# Patient Record
Sex: Female | Born: 1970 | Race: Black or African American | Hispanic: No | Marital: Married | Smoking: Former smoker
Health system: Southern US, Community
[De-identification: ages and names within clinical notes are randomized; demographics above are authoritative.]

## PROBLEM LIST (undated history)

## (undated) DIAGNOSIS — J45909 Unspecified asthma, uncomplicated: Secondary | ICD-10-CM

## (undated) DIAGNOSIS — I10 Essential (primary) hypertension: Secondary | ICD-10-CM

## (undated) DIAGNOSIS — M1990 Unspecified osteoarthritis, unspecified site: Secondary | ICD-10-CM

## (undated) DIAGNOSIS — F419 Anxiety disorder, unspecified: Secondary | ICD-10-CM

## (undated) DIAGNOSIS — E785 Hyperlipidemia, unspecified: Secondary | ICD-10-CM

## (undated) DIAGNOSIS — M8700 Idiopathic aseptic necrosis of unspecified bone: Secondary | ICD-10-CM

## (undated) DIAGNOSIS — G709 Myoneural disorder, unspecified: Secondary | ICD-10-CM

## (undated) DIAGNOSIS — K219 Gastro-esophageal reflux disease without esophagitis: Secondary | ICD-10-CM

## (undated) DIAGNOSIS — F32A Depression, unspecified: Secondary | ICD-10-CM

## (undated) DIAGNOSIS — E119 Type 2 diabetes mellitus without complications: Secondary | ICD-10-CM

## (undated) HISTORY — DX: Hyperlipidemia, unspecified: E78.5

## (undated) HISTORY — DX: Gastro-esophageal reflux disease without esophagitis: K21.9

## (undated) HISTORY — DX: Anxiety disorder, unspecified: F41.9

## (undated) HISTORY — PX: HEMORRHOID SURGERY: SHX153

## (undated) HISTORY — DX: Depression, unspecified: F32.A

## (undated) HISTORY — PX: ABDOMINAL HYSTERECTOMY: SHX81

## (undated) HISTORY — DX: Unspecified asthma, uncomplicated: J45.909

---

## 2000-08-31 ENCOUNTER — Encounter: Payer: Self-pay | Admitting: *Deleted

## 2000-08-31 ENCOUNTER — Emergency Department (HOSPITAL_COMMUNITY): Admission: EM | Admit: 2000-08-31 | Discharge: 2000-08-31 | Payer: Self-pay | Admitting: *Deleted

## 2000-09-10 ENCOUNTER — Observation Stay (HOSPITAL_COMMUNITY): Admission: RE | Admit: 2000-09-10 | Discharge: 2000-09-11 | Payer: Self-pay | Admitting: *Deleted

## 2003-11-06 ENCOUNTER — Emergency Department (HOSPITAL_COMMUNITY): Admission: EM | Admit: 2003-11-06 | Discharge: 2003-11-06 | Payer: Self-pay | Admitting: Emergency Medicine

## 2004-08-07 ENCOUNTER — Emergency Department (HOSPITAL_COMMUNITY): Admission: EM | Admit: 2004-08-07 | Discharge: 2004-08-07 | Payer: Self-pay | Admitting: Emergency Medicine

## 2004-08-16 ENCOUNTER — Emergency Department (HOSPITAL_COMMUNITY): Admission: EM | Admit: 2004-08-16 | Discharge: 2004-08-16 | Payer: Self-pay | Admitting: *Deleted

## 2005-01-08 ENCOUNTER — Emergency Department (HOSPITAL_COMMUNITY): Admission: EM | Admit: 2005-01-08 | Discharge: 2005-01-08 | Payer: Self-pay | Admitting: Emergency Medicine

## 2006-01-02 ENCOUNTER — Emergency Department (HOSPITAL_COMMUNITY): Admission: EM | Admit: 2006-01-02 | Discharge: 2006-01-02 | Payer: Self-pay | Admitting: Emergency Medicine

## 2006-08-23 ENCOUNTER — Emergency Department (HOSPITAL_COMMUNITY): Admission: EM | Admit: 2006-08-23 | Discharge: 2006-08-23 | Payer: Self-pay | Admitting: Emergency Medicine

## 2006-11-05 ENCOUNTER — Emergency Department (HOSPITAL_COMMUNITY): Admission: EM | Admit: 2006-11-05 | Discharge: 2006-11-05 | Payer: Self-pay | Admitting: Emergency Medicine

## 2006-11-18 ENCOUNTER — Emergency Department (HOSPITAL_COMMUNITY): Admission: EM | Admit: 2006-11-18 | Discharge: 2006-11-18 | Payer: Self-pay | Admitting: Emergency Medicine

## 2007-10-12 ENCOUNTER — Ambulatory Visit (HOSPITAL_COMMUNITY): Admission: RE | Admit: 2007-10-12 | Discharge: 2007-10-12 | Payer: Self-pay | Admitting: General Surgery

## 2007-10-12 ENCOUNTER — Encounter (INDEPENDENT_AMBULATORY_CARE_PROVIDER_SITE_OTHER): Payer: Self-pay | Admitting: General Surgery

## 2007-12-15 ENCOUNTER — Ambulatory Visit: Payer: Self-pay | Admitting: Gastroenterology

## 2007-12-21 ENCOUNTER — Ambulatory Visit (HOSPITAL_COMMUNITY): Admission: RE | Admit: 2007-12-21 | Discharge: 2007-12-21 | Payer: Self-pay | Admitting: Gastroenterology

## 2007-12-21 ENCOUNTER — Encounter: Payer: Self-pay | Admitting: Gastroenterology

## 2007-12-21 ENCOUNTER — Ambulatory Visit: Payer: Self-pay | Admitting: Gastroenterology

## 2010-07-29 NOTE — Consult Note (Signed)
NAMEGENEVIVE, PRINTUP               ACCOUNT NO.:  [id number]   MEDICAL RECORD NO.:  [id number]          PATIENT TYPE:  AMB   LOCATION:  DAY                           FACILITY:  [facility]   PHYSICIAN:  Kassie Mends, M.D.      DATE OF BIRTH:  [birth year]   DATE OF CONSULTATION:  12/15/2007  DATE OF DISCHARGE:                                 CONSULTATION   REFERRING Eshaal Duby:  Tesfaye D. Felecia Shelling, MD.   REASON FOR CONSULTATION:  Rectal bleeding   HISTORY OF PRESENT ILLNESS:  Lisa [name] is a 40 year old female who 2  weeks ago saw blood, which she wiped after having a bowel movement.  She  denies any constipation or diarrhea.  She has a bowel movement, which is  felt once or twice a day.  Her appetite is good.  She denies any black  stool.  She did not have any heartburn or indigestions or problems  swallowing.  She denies use of aspirin, BC, or Goody Powder.  She uses  ibuprofen once a month.  She denies any weight loss.  Her appetite has  been good.   PAST MEDICAL HISTORY:  Hypertension.   ALLERGIES:  SULFA and DOXYCYCLINE.   MEDICATIONS:  Hydrochlorothiazide 12.5 mg daily.   FAMILY HISTORY:  She has no family history of colon cancer or colon  polyps.   SOCIAL HISTORY:  She is married for 14 years and has one 61 year old  child.  She works putting together cars.  She occasionally smokes when  she is depressed.  She denies any alcohol use.   REVIEW OF SYSTEMS:  As per the HPI.  Otherwise, all systems are  negative.   PHYSICAL EXAMINATION:  VITAL SIGNS:  Weight 255 pounds, height 5 feet 6  inches, temperature 98.7, blood pressure 120/80, and pulse 72.  GENERAL:  She is in no apparent distress, alert and oriented x4.HEENT:  Atraumatic and normocephalic.  Pupils equal and react to light.  Mouth,  no oral lesions.  Posterior pharynx without erythema or exudate.NECK:  Full range of motion.  No lymphadenopathy.LUNGS:  Clear to auscultation  bilaterally.CARDIOVASCULAR:  Regular rate and  rhythm.  No murmur.  Normal S1 and S2. ABDOMEN:  Bowel sounds are present.  Soft, nontender,  and nondistended.  No rebound or guarding.EXTREMITIES:  No cyanosis or  edema.   LABORATORY DATA:  In July 2009, her white count was 5.4, hemoglobin  13.2, and platelets 215.   ASSESSMENT:  Lisa [name] is a 40 year old female who has pain with rectal  bleeding.  The differential diagnosis include hemorrhoids, colorectal  polyp, or cancer.  Thank you for allowing me to see Lisa [name] in  consultation.  My recommendation are as follows.   RECOMMENDATIONS:  1. She will be scheduled for a colonoscopy with a HalfLytely bowel      prep next Wednesday.  2. She may follow up with me as needed.      Kassie Mends, M.D.  Electronically Signed     SM/MEDQ  D:  12/15/2007  T:  12/16/2007  Job:  [id number]   cc:  Tesfaye D. Legrand Rams, MD  Fax: [phone]

## 2010-07-29 NOTE — Op Note (Signed)
[name], [name]               ACCOUNT NO.:  [id number]   MEDICAL RECORD NO.:  [id number]          PATIENT TYPE:  AMB   LOCATION:  DAY                           FACILITY:  [facility]   PHYSICIAN:  Barbaraann Barthel, M.D. DATE OF BIRTH:  [birth year]   DATE OF PROCEDURE:  10/12/2007  DATE OF DISCHARGE:                               OPERATIVE REPORT   SURGEON:  Barbaraann Barthel, MD   PREOPERATIVE DIAGNOSIS:  Left axillary abscess, possible hidradenitis  suppurativa.   POSTOPERATIVE DIAGNOSIS:  Left axillary abscess, possible hidradenitis  suppurativa.   NOTE:  This is a 40 year old black female who had a previous abscesses  under her axillae.  She had previous excision of right axillary abscess  by Dr. Katrinka Blazing and she presented a couple weeks ago with drainage from her  left axilla and she was referred to my office.  She was placed on  antibiotics.  Apparently, she had some reaction to tetracycline, this  was discontinued, and the patient took Benadryl.  There were no new  rashes or any other signs of allergic reaction.  The patient was  admitted and Cipro was started in the outpatient department.  We planned  for an excision of this area.  We discussed complications not limited,  but including bleeding, infection, and possibility of further surgery  might be needed.  Informed consent was obtained.   GROSS FINDINGS:  Sinus tracts and an area of chronic inflammation in the  area of the left axilla.  This was excised and debrided.  Cultures were  sent.   TECHNIQUE:  The patient was placed in supine position.  After the  adequate administration of general anesthesia via LMA anesthesia, her  left arm and axillary area was prepped with Betadine solution and draped  in usual manner.  A limb isolator was used.  The area of the abscess was  excised continuously in order to remove the sinus tract along with it.  This was irrigated with normal saline solution.  The bleeding was  controlled with  a cautery device and I packed the wound open with the  sterile 4 x 4 soaked in saline and ABD pad, and sterile dressing was  applied.  Prior to closure, all sponge, needle, and instrument counts  were found to be correct.  Estimated blood loss was minimal.  Cultures  were obtained.  The patient received 500 mL crystalloids  intraoperatively.  There were no complications.  The wound  classification is contaminated.      Barbaraann Barthel, M.D.  Electronically Signed    WB/MEDQ  D:  10/12/2007  T:  10/13/2007  Job:  [id number]   cc:   Tesfaye D. Felecia Shelling, MD  Fax: [phone]

## 2010-07-29 NOTE — Op Note (Signed)
NAMESARAHBETH, Lisa [name]               ACCOUNT NO.:  [id number]   MEDICAL RECORD NO.:  [id number]          PATIENT TYPE:  AMB   LOCATION:  DAY                           FACILITY:  [facility]   PHYSICIAN:  Kassie Mends, M.D.      DATE OF BIRTH:  Apr 17, 1970   DATE OF PROCEDURE:  12/21/2007  DATE OF DISCHARGE:                               OPERATIVE REPORT   REFERRING PHYSICIAN:  Tesfaye D. Fanta, MD   PROCEDURE:  Ileocolonoscopy with cold forceps biopsy of the terminal  ileum.   INDICATION FOR EXAM:  Lisa Lisa [name] is a 40 year old female who complains  of rectal bleeding.  She also uses ibuprofen once a month.   FINDINGS:  1. Approximately 28-cm of the distal terminal ileum visualized.  The      visualized portion of the terminal ileum showed patchy erythematous      nodules with central ulceration.  Intermittently, she had normal      mucosa.  Biopsies were obtained via cold forceps.  2. Normal colon without evidence of polyps, mass, inflammatory      changes, diverticular AVMs.  3. Small internal hemorrhoids.  Otherwise, normal retroflexed view of      the rectum.   DIAGNOSIS:  Terminal ileitis.  The differential diagnosis includes NSAID-  induced disease or inflammatory bowel disease.  She has a little  likelihood of infectious etiology.  She is not having any diarrhea.   RECOMMENDATIONS:  1. No aspirin or NSAIDs for 30 days.  No anticoagulation for 5 days.  2. She should follow a high-fiber diet.  She is given handout on high-      fiber diet and hemorrhoids.  3. Screening colonoscopy at age 43.  4. Will call her with results of her biopsies.  After the results of      her biopsies are known, then we will schedule her follow up      appointment.   MEDICATIONS:  1. Demerol 75 mg IV.  2. Versed 4 mg IV.   PROCEDURE TECHNIQUE:  Physical exam was performed.  Informed consent was  obtained, from the patient explaining benefits, risks, and alternatives  to the procedure.  The patient was  connected to the monitor, placed in  the left lateral position.  Continuous oxygen was provided by nasal  cannula.  IV medicine administered through an indwelling cannula.  After  administration of sedation and rectal exam, the patient's rectum was  intubated and the scope was advanced under direct visualization to the  distal terminal ileum.  The scope was removed slowly by  carefully examining the color, texture, anatomy, and integrity of the  mucosa on the way out.  The patient was recovered in endoscopy and  discharged home in satisfactory condition.   PATH:  Acute focal inflammation and surface ulcerations likely 2o to  NSAIDs.      Kassie Mends, M.D.  Electronically Signed     SM/MEDQ  D:  12/21/2007  T:  12/21/2007  Job:  [id number]   cc:   Tesfaye D. Felecia Shelling, MD  Fax: [phone]

## 2010-12-12 LAB — CBC
Hemoglobin: 13.2
MCHC: 33.6
MCV: 89.4
RBC: 4.4
RDW: 12.2

## 2010-12-12 LAB — DIFFERENTIAL
Basophils Absolute: 0
Basophils Relative: 0
Eosinophils Absolute: 0.1
Monocytes Absolute: 0.4
Monocytes Relative: 7
Neutro Abs: 2.2

## 2010-12-12 LAB — BASIC METABOLIC PANEL
CO2: 29
Calcium: 9.8
Chloride: 107
Creatinine, Ser: 0.98
GFR calc Af Amer: >60
Glucose, Bld: 128 — ABNORMAL HIGH
Sodium: 140

## 2010-12-12 LAB — ANAEROBIC CULTURE

## 2010-12-12 LAB — CULTURE, ROUTINE-ABSCESS

## 2012-08-09 ENCOUNTER — Emergency Department (HOSPITAL_COMMUNITY)
Admission: EM | Admit: 2012-08-09 | Discharge: 2012-08-09 | Disposition: A | Discharge status: Home / Self Care | Payer: BC Managed Care – PPO | Attending: Emergency Medicine | Admitting: Emergency Medicine

## 2012-08-09 ENCOUNTER — Encounter (HOSPITAL_COMMUNITY): Payer: Self-pay

## 2012-08-09 DIAGNOSIS — IMO0002 Reserved for concepts with insufficient information to code with codable children: Secondary | ICD-10-CM | POA: Insufficient documentation

## 2012-08-09 DIAGNOSIS — I10 Essential (primary) hypertension: Secondary | ICD-10-CM | POA: Insufficient documentation

## 2012-08-09 DIAGNOSIS — L02412 Cutaneous abscess of left axilla: Secondary | ICD-10-CM

## 2012-08-09 DIAGNOSIS — Z79899 Other long term (current) drug therapy: Secondary | ICD-10-CM | POA: Insufficient documentation

## 2012-08-09 DIAGNOSIS — E119 Type 2 diabetes mellitus without complications: Secondary | ICD-10-CM | POA: Insufficient documentation

## 2012-08-09 DIAGNOSIS — F17200 Nicotine dependence, unspecified, uncomplicated: Secondary | ICD-10-CM | POA: Insufficient documentation

## 2012-08-09 HISTORY — DX: Essential (primary) hypertension: I10

## 2012-08-09 HISTORY — DX: Type 2 diabetes mellitus without complications: E11.9

## 2012-08-09 MED ORDER — HYDROCODONE-ACETAMINOPHEN 5-325 MG PO TABS: ORAL_TABLET | ORAL | Status: DC

## 2012-08-09 MED ORDER — LIDOCAINE HCL (PF) 1 % IJ SOLN
5.0000 mL | Freq: Once | INTRAMUSCULAR | Status: AC
  Administered 2012-08-09: 5 mL via INTRADERMAL
  Filled 2012-08-09: qty 5

## 2012-08-09 NOTE — ED Notes (Signed)
Pt c/o abscess under left arm x 3 days.  Was seen at [facility] yesterday and given an antibiotic.  Says doesn't feel like it is helping.

## 2012-08-09 NOTE — ED Provider Notes (Signed)
History     CSN: [id number]  Arrival date & time 08/09/12  1213   First MD Initiated Contact with Patient 08/09/12 1313      Chief Complaint  Patient presents with  . Abscess    (Consider location/radiation/quality/duration/timing/severity/associated sxs/prior treatment) HPI Comments: Lisa [name] is a 42 y.o. female who presents to the Emergency Department complaining of abscess to the left axilla.  States she was seen at another hospital ED yesterday for same and states she was given an antibiotic and advised to apply warm compresses.  She comes here today c/o worsening pain and increased redness to the area.  She states pain is worse with palpation and arm movement.  She denies fever, vomiting or red streaks.    The history is provided by the patient.    Past Medical History  Diagnosis Date  . Diabetes mellitus without complication   . Hypertension     Past Surgical History  Procedure Laterality Date  . Abdominal hysterectomy    . Hemorrhoid surgery      No family history on file.  History  Substance Use Topics  . Smoking status: Current Every Day Smoker  . Smokeless tobacco: Not on file  . Alcohol Use: No    OB History   Grav Para Term Preterm Abortions TAB SAB Ect Mult Living                  Review of Systems  Constitutional: Negative for fever and chills.  Gastrointestinal: Negative for nausea, vomiting and abdominal pain.  Musculoskeletal: Negative for joint swelling and arthralgias.  Skin: Positive for color change.       Abscess   Hematological: Negative for adenopathy.  All other systems reviewed and are negative.    Allergies  Peanut-containing drug products; Doxycycline; and Sulfa antibiotics  Home Medications   Current Outpatient Rx  Name  Route  Sig  Dispense  Refill  . clindamycin (CLEOCIN) 150 MG capsule   Oral   Take 150 mg by mouth 3 (three) times daily.         Marland Kitchen glipiZIDE-metformin (METAGLIP) 5-500 MG per tablet   Oral  Take 1 tablet by mouth 2 (two) times daily before a meal.         . losartan-hydrochlorothiazide (HYZAAR) 50-12.5 MG per tablet   Oral   Take 1 tablet by mouth daily.           BP 136/85  Pulse 96  Temp(Src) 98.3 F (36.8 C) (Oral)  Resp 20  SpO2 99%  Physical Exam  Nursing note and vitals reviewed. Constitutional: She is oriented to person, place, and time. She appears well-developed and well-nourished. No distress.  HENT:  Head: Normocephalic and atraumatic.  Cardiovascular: Normal rate, regular rhythm, normal heart sounds and intact distal pulses.   No murmur heard. Pulmonary/Chest: Effort normal and breath sounds normal. No respiratory distress.  Neurological: She is alert and oriented to person, place, and time. She exhibits normal muscle tone. Coordination normal.  Skin: Skin is warm. There is erythema.  Abscess to the left axilla with moderate fluctuance and surrounding erythema.  No drainage or lymphangitis.  Radial pulse brisk.       ED Course  Procedures (including critical care time)  Labs Reviewed - No data to display No results found.      MDM    INCISION AND DRAINAGE Performed by: Maxwell Caul. Consent: Verbal consent obtained. Risks and benefits: risks, benefits and alternatives were discussed Type:  abscess  Body area: left axilla Anesthesia: local infiltration  Incision was made with a #11 scalpel.  Local anesthetic: lidocaine 1% w/o epinephrine  Anesthetic total: 3 ml  Complexity: complex Blunt dissection to break up loculations  Drainage: purulent  Drainage amount: copious  Packing material: 1/4 in iodoform gauze  Patient tolerance: Patient tolerated the procedure well with no immediate complications.      Patient agrees to continue her antibiotic as directed, warm compresses and remove the packing in 2 days.  Agrees to return here if sx's worsen   The patient appears reasonably screened and/or stabilized for discharge  and I doubt any other medical condition or other [facility] requiring further screening, evaluation, or treatment in the ED at this time prior to discharge.   Veva Grimley L. Trisha Mangle, PA-C 08/09/12 2157

## 2012-08-09 NOTE — ED Notes (Signed)
Wound dressed with telfa, gauze, and coban.

## 2012-08-11 ENCOUNTER — Encounter (HOSPITAL_COMMUNITY): Payer: Self-pay | Admitting: *Deleted

## 2012-08-11 ENCOUNTER — Emergency Department (HOSPITAL_COMMUNITY)
Admission: EM | Admit: 2012-08-11 | Discharge: 2012-08-11 | Disposition: A | Discharge status: Home / Self Care | Payer: BC Managed Care – PPO | Attending: Emergency Medicine | Admitting: Emergency Medicine

## 2012-08-11 DIAGNOSIS — I10 Essential (primary) hypertension: Secondary | ICD-10-CM | POA: Insufficient documentation

## 2012-08-11 DIAGNOSIS — Z4801 Encounter for change or removal of surgical wound dressing: Secondary | ICD-10-CM | POA: Insufficient documentation

## 2012-08-11 DIAGNOSIS — Z792 Long term (current) use of antibiotics: Secondary | ICD-10-CM | POA: Insufficient documentation

## 2012-08-11 DIAGNOSIS — Z79899 Other long term (current) drug therapy: Secondary | ICD-10-CM | POA: Insufficient documentation

## 2012-08-11 DIAGNOSIS — F17200 Nicotine dependence, unspecified, uncomplicated: Secondary | ICD-10-CM | POA: Insufficient documentation

## 2012-08-11 DIAGNOSIS — E119 Type 2 diabetes mellitus without complications: Secondary | ICD-10-CM | POA: Insufficient documentation

## 2012-08-11 DIAGNOSIS — L0291 Cutaneous abscess, unspecified: Secondary | ICD-10-CM

## 2012-08-11 MED ORDER — CLINDAMYCIN HCL 150 MG PO CAPS: 300.0000 mg | ORAL_CAPSULE | Freq: Four times a day (QID) | ORAL | Status: DC

## 2012-08-11 NOTE — ED Notes (Signed)
Packing removal from left upper arm placed 2 days ago.  Denies pain.

## 2012-08-11 NOTE — ED Provider Notes (Signed)
History     CSN: [id number]  Arrival date & time 08/11/12  4098   First MD Initiated Contact with Patient 08/11/12 [phone]      Chief Complaint  Patient presents with  . packing removal     (Consider location/radiation/quality/duration/timing/severity/associated sxs/prior treatment) HPI Comments: Lisa [name] is a 42 y.o. female who presents to the Emergency Department requesting packing removal from an abscess that was drained two days ago.  States the pain is improved.  States she is taking clindamycin daily.  She denies fever , chills, or worsening symptoms  The history is provided by the patient.    Past Medical History  Diagnosis Date  . Diabetes mellitus without complication   . Hypertension     Past Surgical History  Procedure Laterality Date  . Abdominal hysterectomy    . Hemorrhoid surgery      No family history on file.  History  Substance Use Topics  . Smoking status: Current Every Day Smoker  . Smokeless tobacco: Not on file  . Alcohol Use: No    OB History   Grav Para Term Preterm Abortions TAB SAB Ect Mult Living                  Review of Systems  Constitutional: Negative for fever and chills.  Gastrointestinal: Negative for nausea and vomiting.  Musculoskeletal: Negative for joint swelling and arthralgias.  Skin: Positive for color change.       Abscess   Hematological: Negative for adenopathy.  All other systems reviewed and are negative.    Allergies  Peanut-containing drug products; Doxycycline; and Sulfa antibiotics  Home Medications   Current Outpatient Rx  Name  Route  Sig  Dispense  Refill  . clindamycin (CLEOCIN) 150 MG capsule   Oral   Take 150 mg by mouth 3 (three) times daily.         Marland Kitchen glipiZIDE-metformin (METAGLIP) 5-500 MG per tablet   Oral   Take 1 tablet by mouth 2 (two) times daily before a meal.         . HYDROcodone-acetaminophen (NORCO/VICODIN) 5-325 MG per tablet      Take one-two tabs po q 4-6 hrs prn  pain   20 tablet   0   . losartan-hydrochlorothiazide (HYZAAR) 50-12.5 MG per tablet   Oral   Take 1 tablet by mouth daily.           BP 119/51  Pulse 62  Temp(Src) 98.4 F (36.9 C) (Oral)  Resp 16  SpO2 100%  Physical Exam  Nursing note and vitals reviewed. Constitutional: She is oriented to person, place, and time. She appears well-developed and well-nourished. No distress.  HENT:  Head: Normocephalic and atraumatic.  Cardiovascular: Normal rate, regular rhythm and normal heart sounds.   Pulmonary/Chest: Effort normal and breath sounds normal.  Neurological: She is alert and oriented to person, place, and time. She exhibits normal muscle tone. Coordination normal.  Skin: Skin is warm. There is erythema.  Abscess to the left axilla.  Mild surrounding erythema still remains.  Abscess appears to be healing well.      ED Course  Procedures (including critical care time)  Labs Reviewed - No data to display No results found.      MDM    Patient currently taking Clindamycin.  Abscess appears to be improving.  Packing removed by me w/o difficulty.  Mild surrounding erythema still remains.  I will have patient increase the dose from 150 mg  TID to 300 mg QID .  She agrees to continue warm wet compresses and return here in 2-3 days if the redness is increasing.        Eason Housman L. Trisha Mangle, PA-C 08/11/12 2213

## 2012-08-11 NOTE — ED Notes (Signed)
Applied Telfa pad, kerlix and Coban dressing to L axilla abscess site.  Patient tolerated well.

## 2012-08-11 NOTE — ED Provider Notes (Signed)
Medical screening examination/treatment/procedure(s) were performed by non-physician practitioner and as supervising physician I was immediately available for consultation/collaboration.   Arrion Burruel W. Marguis Mathieson, MD 08/11/12 0032 

## 2012-08-12 NOTE — ED Provider Notes (Signed)
Medical screening examination/treatment/procedure(s) were performed by non-physician practitioner and as supervising physician I was immediately available for consultation/collaboration.   Joya Gaskins, MD 08/12/12 [phone]

## 2013-03-22 ENCOUNTER — Ambulatory Visit (INDEPENDENT_AMBULATORY_CARE_PROVIDER_SITE_OTHER): Payer: BC Managed Care – PPO | Admitting: Orthopedic Surgery

## 2013-03-22 ENCOUNTER — Encounter: Payer: Self-pay | Admitting: Orthopedic Surgery

## 2013-03-22 VITALS — BP 145/88 | Ht 66.0 in | Wt 250.0 lb

## 2013-03-22 DIAGNOSIS — M7552 Bursitis of left shoulder: Secondary | ICD-10-CM

## 2013-03-22 DIAGNOSIS — M87059 Idiopathic aseptic necrosis of unspecified femur: Secondary | ICD-10-CM

## 2013-03-22 DIAGNOSIS — M5126 Other intervertebral disc displacement, lumbar region: Secondary | ICD-10-CM

## 2013-03-22 DIAGNOSIS — M67919 Unspecified disorder of synovium and tendon, unspecified shoulder: Secondary | ICD-10-CM

## 2013-03-22 DIAGNOSIS — M719 Bursopathy, unspecified: Secondary | ICD-10-CM

## 2013-03-22 NOTE — Patient Instructions (Addendum)
[facility] ordered bilateral hips and Lspine
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
## 2013-04-04 ENCOUNTER — Ambulatory Visit
Admission: RE | Admit: 2013-04-04 | Discharge: 2013-04-04 | Disposition: A | Discharge status: Home / Self Care | Payer: BC Managed Care – PPO | Source: Ambulatory Visit | Attending: Orthopedic Surgery | Admitting: Orthopedic Surgery

## 2013-04-04 DIAGNOSIS — M87059 Idiopathic aseptic necrosis of unspecified femur: Secondary | ICD-10-CM

## 2013-04-04 DIAGNOSIS — M5126 Other intervertebral disc displacement, lumbar region: Secondary | ICD-10-CM

## 2013-04-05 ENCOUNTER — Telehealth: Payer: Self-pay | Admitting: Orthopedic Surgery

## 2013-04-05 NOTE — Telephone Encounter (Signed)
Received call from Debra, [facility], [place] location, ph# [phone], relaying that # of visits on order received is indicated as "1"; patient's appointment is scheduled for today, 04/05/13, 4:45pm; please advise regarding # of visits and weeks for diagnosis, bursitis of shoulder.  States therapist's recommendation for this problem is 3x per week for minimum of 4 weeks, or as Dr. Romeo AppleHarrison advises.  Please call phone # above,  or fax orders to fax # [phone].

## 2013-04-05 NOTE — Telephone Encounter (Signed)
Advised that the order automatically defaults to 1 visit but Dr. Romeo AppleHarrison normally does 3 times a week for 6 weeks. The patient came in to bring her MRI disc and stated she is waiting on her flex card to pay.

## 2013-04-11 ENCOUNTER — Telehealth: Payer: Self-pay | Admitting: Orthopedic Surgery

## 2013-04-11 NOTE — Telephone Encounter (Signed)
Lisa AceAngela [name] asked for a call about her MRI results.  Her # [phone]

## 2013-04-24 ENCOUNTER — Encounter: Payer: Self-pay | Admitting: Orthopedic Surgery

## 2013-05-30 ENCOUNTER — Ambulatory Visit (INDEPENDENT_AMBULATORY_CARE_PROVIDER_SITE_OTHER): Payer: BC Managed Care – PPO | Admitting: Orthopedic Surgery

## 2013-05-30 VITALS — BP 129/88 | Ht 66.0 in | Wt 250.0 lb

## 2013-05-30 DIAGNOSIS — M87059 Idiopathic aseptic necrosis of unspecified femur: Secondary | ICD-10-CM

## 2013-05-30 DIAGNOSIS — M879 Osteonecrosis, unspecified: Secondary | ICD-10-CM | POA: Insufficient documentation

## 2013-05-30 NOTE — Patient Instructions (Addendum)
You are being referred to a joint specialist to take over care and evaluate you for possible hip replacement (DR. Charlann Boxer)   Encounter Diagnosis  Name Primary?  . Osteonecrosis of hip Yes    Avascular Necrosis Avascular necrosis is a disease resulting from the temporary or permanent loss of the blood supply to the bones. Without blood, the bone tissue dies and causes the bone to become soft. If the process involves the bone near a joint, it may lead to collapse of the joint surface. This disease is also known as:  Osteonecrosis.  Aseptic necrosis.  Ischemic bone necrosis. Avascular necrosis most commonly affects the ends (epiphysis) of long bones. The femur, the bone extending from the knee joint to the hip joint, is the bone most commonly involved. The disease may affect 1 bone, more than 1 bone at the same time, more than 1 bone at different times. It affects men and women equally. Avascular necrosis occurs at any age. But it is more common between the ages of 23 and 50 years. SYMPTOMS  In early stages patients may not have any symptoms. But as the disease progresses, joint pain generally develops. At first there is pain when putting weight on the affected joint, and then when resting. Pain usually develops gradually. It may be mild or severe. As the disease progresses and the bone and surrounding joint surface collapses, pain may develop or increase dramatically. Pain may be severe enough to limit range of motion in the affected joint. The period of time between the first symptoms and loss of joint function is different for each patient. This can range from several months to more than a year. Disability depends on:  What part of the bone is affected.  How large an area is involved.  How effectively the bone repairs itself.  If other illnesses are present.  If you are being treated for cancer with medications (chemotherapy).  Radiation.  The cause of the avascular necrosis. DIAGNOSIS   The diagnosis of aseptic necrosis is usually made by:  Taking a history.  Doing an exam.  Taking X-rays. (If X-rays are normal, an MRI may be required.)  Sometimes further blood work and specialized studies may be necessary. TREATMENT  Treatment for this disease is necessary to maintain joint function. If untreated, most patients will suffer severe pain and limitation in movement within 2 years. Several treatments are available that help prevent further bone and joint damage. They can also reduce pain. To determine the most appropriate treatment, the caregiver considers the following aspects of a patient's disease:  The age of the patient.  The stage of the disease (early or late).  The location and amount of bone affected. It may be a small or large area.  The underlying cause of avascular necrosis. The goals in treatment are to:  Improve the patient's use of the affected joint.  Stop further damage to the bone.  Improve bone and joint survival. Your caregiver may use one or more of the following treatments:  Reduced weight bearing. If avascular necrosis is diagnosed early, the caregiver may begin treatment by having the patient limit weight on the affected joint. The caregiver may recommend limiting activities or using crutches. In some cases, reduced weight bearing can slow the damage caused by the disease and permit natural healing. When combined with medication to reduce pain, reduced weight bearing can be an effective way to avoid or delay surgery for some patients. Most patients eventually will need surgery to  reconstruct the joint.  Core decompression. Core decompression works best in people who are in the earliest stages of avascular necrosis, before the collapse of the joint. This procedure often can reduce pain and slow the progression of bone and joint destruction in these patients. This surgical procedure removes the inner layer of bone, which:  Reduces pressure within  the bone.  Increases blood flow to the bone.  Allows more blood vessels to form.  Reduces pain.  Osteotomy. This surgical procedure re-shapes the bone to reduce stress on the affected area of the joint. There is a lengthy recovery period. The patient's activities are very limited for 3 to 12 months after an osteotomy. This procedure is most effective for younger patients with advanced avascular necrosis, and those with a large area of affected bone.  Bone Graft. A bone graft may be used to support a joint after core decompression. Bone grafting is surgery that transplants healthy bone from one part of the patient, such as the leg, to the diseased area. Sometimes the bone is taken with it's blood vessels which are attached to local blood vessels near the area of bone collapse. This is called a vascularized bone graft. There is a lengthy recovery period after a bone graft, usually from 6 to 12 months. This procedure is technically complex.  Arthroplasty. Arthroplasty is also known as total joint replacement. Total joint replacement is used in late-stage avascular necrosis, and when the joint is deformed. In this surgery, the diseased joint is replaced with artificial parts. It may be recommended for people who are not good candidates for other treatments, such as patients who may not do well with repeated attempts to preserve the joint. Various types of replacements are available, and patients should discuss specific needs with their caregiver. New treatments being tried include:  The use of medications.  Electrical stimulation.  Combination therapies to increase the growth of new bone and blood vessels. Document Released: 08/22/2001 Document Revised: 05/25/2011 Document Reviewed: 10/23/2008 [facility] Patient Information 2014 [place], [place].

## 2013-05-30 NOTE — Progress Notes (Signed)
Patient ID: Lisa [name], female   DOB: 09/21/1970, 43 y.o.   MRN: [id number]  Chief Complaint  Patient presents with  . Results    MRI results    BP 129/88  Ht 5\' 6"  (1.676 m)  Wt 250 lb (113.399 kg)  BMI 40.37 kg/m2   2 MRIs one of the hip and one of the back she has bilateral AVN left worse than right his left hip pain and some left buttock pain as well. Her MRI shows degenerative disc disease lumbar spine very mild I doubt this is the source of her problem  Her AVN on the left is much more extensive than the AVN on the right I think she should be evaluated for total hip arthroplasty. I think she'll be a good candidate for anterior approach with perhaps a ceramic head and cross-link polyethylene and her age of 43 however, this can be determined and will be determined by the treating physician  She should continue Naprosyn for pain and/or muscle relaxer as needed

## 2013-05-31 ENCOUNTER — Telehealth: Payer: Self-pay | Admitting: *Deleted

## 2013-05-31 NOTE — Telephone Encounter (Signed)
yes

## 2013-05-31 NOTE — Telephone Encounter (Signed)
Patient called to give the names of the two medications that she could not think of during her office visit yesterday (05/30/13). They are Naproxen 500 mg BID and Cyclobenzaprine 10 mg BID. Do you want to refill as directions as stated? Please advise. [facility] [place].

## 2013-06-01 ENCOUNTER — Other Ambulatory Visit: Payer: Self-pay | Admitting: *Deleted

## 2013-06-01 DIAGNOSIS — M879 Osteonecrosis, unspecified: Secondary | ICD-10-CM

## 2013-06-01 MED ORDER — CYCLOBENZAPRINE HCL 10 MG PO TABS: 10.0000 mg | ORAL_TABLET | Freq: Two times a day (BID) | ORAL | Status: DC | PRN

## 2013-06-01 MED ORDER — NAPROXEN 500 MG PO TABS: 500.0000 mg | ORAL_TABLET | Freq: Two times a day (BID) | ORAL | Status: DC

## 2013-06-01 NOTE — Telephone Encounter (Signed)
Medications sent electronically to pharmacy.

## 2013-06-05 ENCOUNTER — Telehealth: Payer: Self-pay | Admitting: *Deleted

## 2013-06-05 DIAGNOSIS — M8700 Idiopathic aseptic necrosis of unspecified bone: Secondary | ICD-10-CM

## 2013-06-05 NOTE — Telephone Encounter (Signed)
[facility] Patient is scheduled to see Dr. Charlann Boxerlin 08/09/13 at 3:30 pm. Patient aware.

## 2013-07-10 ENCOUNTER — Other Ambulatory Visit: Payer: Self-pay | Admitting: *Deleted

## 2013-07-10 DIAGNOSIS — M879 Osteonecrosis, unspecified: Secondary | ICD-10-CM

## 2013-07-10 MED ORDER — CYCLOBENZAPRINE HCL 10 MG PO TABS: 10.0000 mg | ORAL_TABLET | Freq: Two times a day (BID) | ORAL | Status: DC | PRN

## 2013-08-08 ENCOUNTER — Other Ambulatory Visit: Payer: Self-pay | Admitting: *Deleted

## 2013-08-08 DIAGNOSIS — M879 Osteonecrosis, unspecified: Secondary | ICD-10-CM

## 2013-08-08 MED ORDER — NAPROXEN 500 MG PO TABS: 500.0000 mg | ORAL_TABLET | Freq: Two times a day (BID) | ORAL | Status: DC

## 2013-09-21 ENCOUNTER — Other Ambulatory Visit: Payer: Self-pay | Admitting: *Deleted

## 2013-09-21 ENCOUNTER — Encounter (HOSPITAL_COMMUNITY): Payer: Self-pay | Admitting: Pharmacy Technician

## 2013-09-21 MED ORDER — CYCLOBENZAPRINE HCL 10 MG PO TABS: 10.0000 mg | ORAL_TABLET | Freq: Two times a day (BID) | ORAL | Status: DC | PRN

## 2013-09-29 NOTE — Patient Instructions (Addendum)
20 Lisa [name]  09/29/2013   Your procedure is scheduled on: Monday July 27th, 2015  [facility] and follow signs to  Short Stay Center at 1100 AM.  Call this number if you have problems the morning of surgery [phone]   Remember:  Do not eat food  :After Midnight.               clear liquids midnight until 700 am day of surgry, then nothing by mouth after 700 am.   Take these medicines the morning of surgery with A SIP OF WATER: none                               You may not have any metal on your body including hair pins and piercings  Do not wear jewelry, make-up, lotions, powders, or deodorant.   Men may shave face and neck.  Do not bring valuables to the hospital. [facility] IS NOT RESPONSIBLE FOR VALUABLES.  Contacts, dentures or bridgework may not be worn into surgery.  Leave suitcase in the car. After surgery it may be brought to your room.  For patients admitted to the hospital, checkout time is 11:00 AM the day of discharge.   Patients discharged the day of surgery will not be allowed to drive home.  Name and phone number of your driver:  Special Instructions: N/A ________________________________________________________________________  [facility] - Preparing for [facility] Before surgery, you can play an important role.  Because skin is not sterile, your skin needs to be as free of germs as possible.  You can reduce the number of germs on your skin by washing with CHG (chlorahexidine gluconate) soap before surgery.  CHG is an antiseptic cleaner which kills germs and bonds with the skin to continue killing germs even after washing. Please DO NOT use if you have an allergy to CHG or antibacterial soaps.  If your skin becomes reddened/irritated stop using the CHG and inform your nurse when you arrive at Short Stay. Do not shave (including legs and underarms) for at least 48 hours prior to the first CHG shower.  You may shave your  face/neck. Please follow these instructions carefully:  1.  Shower with CHG Soap the night before surgery and the  morning of Surgery.  2.  If you choose to wash your hair, wash your hair first as usual with your  normal  shampoo.  3.  After you shampoo, rinse your hair and body thoroughly to remove the  shampoo.                           4.  Use CHG as you would any other liquid soap.  You can apply chg directly  to the skin and wash                       Gently with a scrungie or clean washcloth.  5.  Apply the CHG Soap to your body ONLY FROM THE NECK DOWN.   Do not use on face/ open                           Wound or open sores. Avoid contact with eyes, ears mouth and genitals (private parts).  Wash face,  Genitals (private parts) with your normal soap.             6.  Wash thoroughly, paying special attention to the area where your surgery  will be performed.  7.  Thoroughly rinse your body with warm water from the neck down.  8.  DO NOT shower/wash with your normal soap after using and rinsing off  the CHG Soap.                9.  Pat yourself dry with a clean towel.            10.  Wear clean pajamas.            11.  Place clean sheets on your bed the night of your first shower and do not  sleep with pets. Day of Surgery : Do not apply any lotions/deodorants the morning of surgery.  Please wear clean clothes to the hospital/surgery center.  FAILURE TO FOLLOW THESE INSTRUCTIONS MAY RESULT IN THE CANCELLATION OF YOUR SURGERY PATIENT SIGNATURE_________________________________  NURSE SIGNATURE__________________________________  ________________________________________________________________________    CLEAR LIQUID DIET   Foods Allowed                                                                     Foods Excluded  Coffee and tea, regular and decaf                             liquids that you cannot  Plain Jell-O in any flavor                                              see through such as: Fruit ices (not with fruit pulp)                                     milk, soups, orange juice  Iced Popsicles                                    All solid food Carbonated beverages, regular and diet                                    Cranberry, grape and apple juices Sports drinks like Gatorade Lightly seasoned clear broth or consume(fat free) Sugar, honey syrup  Sample Menu Breakfast                                Lunch                                     Supper Cranberry juice  Beef broth                            Chicken broth Jell-O                                     Grape juice                           Apple juice Coffee or tea                        Jell-O                                      Popsicle                                                Coffee or tea                        Coffee or tea  _____________________________________________________________________    Incentive Spirometer  An incentive spirometer is a tool that can help keep your lungs clear and active. This tool measures how well you are filling your lungs with each breath. Taking long deep breaths may help reverse or decrease the chance of developing breathing (pulmonary) problems (especially infection) following:  A long period of time when you are unable to move or be active. BEFORE THE PROCEDURE   If the spirometer includes an indicator to show your best effort, your nurse or respiratory therapist will set it to a desired goal.  If possible, sit up straight or lean slightly forward. Try not to slouch.  Hold the incentive spirometer in an upright position. INSTRUCTIONS FOR USE  1. Sit on the edge of your bed if possible, or sit up as far as you can in bed or on a chair. 2. Hold the incentive spirometer in an upright position. 3. Breathe out normally. 4. Place the mouthpiece in your mouth and seal your lips tightly around it. 5. Breathe in slowly  and as deeply as possible, raising the piston or the ball toward the top of the column. 6. Hold your breath for 3-5 seconds or for as long as possible. Allow the piston or ball to fall to the bottom of the column. 7. Remove the mouthpiece from your mouth and breathe out normally. 8. Rest for a few seconds and repeat Steps 1 through 7 at least 10 times every 1-2 hours when you are awake. Take your time and take a few normal breaths between deep breaths. 9. The spirometer may include an indicator to show your best effort. Use the indicator as a goal to work toward during each repetition. 10. After each set of 10 deep breaths, practice coughing to be sure your lungs are clear. If you have an incision (the cut made at the time of surgery), support your incision when coughing by placing a pillow or rolled up towels firmly against it. Once you are able to get out of bed, walk around indoors and cough well. You may stop using the incentive  spirometer when instructed by your caregiver.  RISKS AND COMPLICATIONS  Take your time so you do not get dizzy or light-headed.  If you are in pain, you may need to take or ask for pain medication before doing incentive spirometry. It is harder to take a deep breath if you are having pain. AFTER USE  Rest and breathe slowly and easily.  It can be helpful to keep track of a log of your progress. Your caregiver can provide you with a simple table to help with this. If you are using the spirometer at home, follow these instructions: SEEK MEDICAL CARE IF:   You are having difficultly using the spirometer.  You have trouble using the spirometer as often as instructed.  Your pain medication is not giving enough relief while using the spirometer.  You develop fever of 100.5 F (38.1 C) or higher. SEEK IMMEDIATE MEDICAL CARE IF:   You cough up bloody sputum that had not been present before.  You develop fever of 102 F (38.9 C) or greater.  You develop  worsening pain at or near the incision site. MAKE SURE YOU:   Understand these instructions.  Will watch your condition.  Will get help right away if you are not doing well or get worse. Document Released: 07/13/2006 Document Revised: 05/25/2011 Document Reviewed: 09/13/2006 Exit[employer] Patient Information 2014 [employer], [place].   ________________________________________________________________________  WHAT IS A BLOOD TRANSFUSION? Blood Transfusion Information  A transfusion is the replacement of blood or some of its parts. Blood is made up of multiple cells which provide different functions.  Red blood cells carry oxygen and are used for blood loss replacement.  White blood cells fight against infection.  Platelets control bleeding.  Plasma helps clot blood.  Other blood products are available for specialized needs, such as hemophilia or other clotting disorders. BEFORE THE TRANSFUSION  Who gives blood for transfusions?   Healthy volunteers who are fully evaluated to make sure their blood is safe. This is blood bank blood. Transfusion therapy is the safest it has ever been in the practice of medicine. Before blood is taken from a donor, a complete history is taken to make sure that person has no history of diseases nor engages in risky social behavior (examples are intravenous drug use or sexual activity with multiple partners). The donor's travel history is screened to minimize risk of transmitting infections, such as malaria. The donated blood is tested for signs of infectious diseases, such as HIV and hepatitis. The blood is then tested to be sure it is compatible with you in order to minimize the chance of a transfusion reaction. If you or a relative donates blood, this is often done in anticipation of surgery and is not appropriate for emergency situations. It takes many days to process the donated blood. RISKS AND COMPLICATIONS Although transfusion therapy is very safe and saves  many lives, the main dangers of transfusion include:   Getting an infectious disease.  Developing a transfusion reaction. This is an allergic reaction to something in the blood you were given. Every precaution is taken to prevent this. The decision to have a blood transfusion has been considered carefully by your caregiver before blood is given. Blood is not given unless the benefits outweigh the risks. AFTER THE TRANSFUSION  Right after receiving a blood transfusion, you will usually feel much better and more energetic. This is especially true if your red blood cells have gotten low (anemic). The transfusion raises the level of the red  blood cells which carry oxygen, and this usually causes an energy increase.  The nurse administering the transfusion will monitor you carefully for complications. HOME CARE INSTRUCTIONS  No special instructions are needed after a transfusion. You may find your energy is better. Speak with your caregiver about any limitations on activity for underlying diseases you may have. SEEK MEDICAL CARE IF:   Your condition is not improving after your transfusion.  You develop redness or irritation at the intravenous (IV) site. SEEK IMMEDIATE MEDICAL CARE IF:  Any of the following symptoms occur over the next 12 hours:  Shaking chills.  You have a temperature by mouth above 102 F (38.9 C), not controlled by medicine.  Chest, back, or muscle pain.  People around you feel you are not acting correctly or are confused.  Shortness of breath or difficulty breathing.  Dizziness and fainting.  You get a rash or develop hives.  You have a decrease in urine output.  Your urine turns a dark color or changes to pink, red, or brown. Any of the following symptoms occur over the next 10 days:  You have a temperature by mouth above 102 F (38.9 C), not controlled by medicine.  Shortness of breath.  Weakness after normal activity.  The white part of the eye turns  yellow (jaundice).  You have a decrease in the amount of urine or are urinating less often.  Your urine turns a dark color or changes to pink, red, or brown. Document Released: 02/28/2000 Document Revised: 05/25/2011 Document Reviewed: 10/17/2007 [facility] Patient Information 2014 [place], [place].  _______________________________________________________________________

## 2013-10-02 ENCOUNTER — Ambulatory Visit (HOSPITAL_COMMUNITY)
Admission: RE | Admit: 2013-10-02 | Discharge: 2013-10-02 | Disposition: A | Discharge status: Home / Self Care | Payer: BC Managed Care – PPO | Source: Ambulatory Visit | Attending: Anesthesiology | Admitting: Anesthesiology

## 2013-10-02 ENCOUNTER — Encounter (HOSPITAL_COMMUNITY): Payer: Self-pay

## 2013-10-02 ENCOUNTER — Other Ambulatory Visit (HOSPITAL_COMMUNITY): Payer: Self-pay | Admitting: Diagnostic Radiology

## 2013-10-02 ENCOUNTER — Other Ambulatory Visit: Payer: Self-pay

## 2013-10-02 ENCOUNTER — Encounter (HOSPITAL_COMMUNITY)
Admission: RE | Admit: 2013-10-02 | Discharge: 2013-10-02 | Disposition: A | Discharge status: Home / Self Care | Payer: BC Managed Care – PPO | Source: Ambulatory Visit | Attending: Orthopedic Surgery | Admitting: Orthopedic Surgery

## 2013-10-02 DIAGNOSIS — Z87891 Personal history of nicotine dependence: Secondary | ICD-10-CM | POA: Insufficient documentation

## 2013-10-02 DIAGNOSIS — Z01812 Encounter for preprocedural laboratory examination: Secondary | ICD-10-CM | POA: Insufficient documentation

## 2013-10-02 DIAGNOSIS — Z01818 Encounter for other preprocedural examination: Secondary | ICD-10-CM | POA: Insufficient documentation

## 2013-10-02 HISTORY — DX: Idiopathic aseptic necrosis of unspecified bone: M87.00

## 2013-10-02 LAB — BASIC METABOLIC PANEL
ANION GAP: 12 (ref 5–15)
BUN: 17 mg/dL (ref 6–23)
CHLORIDE: 102 meq/L (ref 96–112)
CO2: 29 mEq/L (ref 19–32)
CREATININE: 0.98 mg/dL (ref 0.50–1.10)
Calcium: 10.3 mg/dL (ref 8.4–10.5)
GFR calc non Af Amer: 70 mL/min — ABNORMAL LOW (ref >90)
GFR, EST AFRICAN AMERICAN: 81 mL/min — AB (ref >90)
Glucose, Bld: 56 mg/dL — ABNORMAL LOW (ref 70–99)
POTASSIUM: 3.5 meq/L — AB (ref 3.7–5.3)
Sodium: 143 mEq/L (ref 137–147)

## 2013-10-02 LAB — CBC
HCT: 40.9 % (ref 36.0–46.0)
HEMOGLOBIN: 13.3 g/dL (ref 12.0–15.0)
MCH: 29.3 pg (ref 26.0–34.0)
MCHC: 32.5 g/dL (ref 30.0–36.0)
MCV: 90.1 fL (ref 78.0–100.0)
Platelets: 225 10*3/uL (ref 150–400)
RBC: 4.54 MIL/uL (ref 3.87–5.11)
RDW: 13.1 % (ref 11.5–15.5)
WBC: 6.7 10*3/uL (ref 4.0–10.5)

## 2013-10-02 LAB — PROTIME-INR
INR: 1.02 (ref 0.00–1.49)
PROTHROMBIN TIME: 13.4 s (ref 11.6–15.2)

## 2013-10-02 LAB — URINALYSIS, ROUTINE W REFLEX MICROSCOPIC
BILIRUBIN URINE: NEGATIVE
Glucose, UA: NEGATIVE mg/dL
Hgb urine dipstick: NEGATIVE
Ketones, ur: NEGATIVE mg/dL
Leukocytes, UA: NEGATIVE
NITRITE: NEGATIVE
Protein, ur: NEGATIVE mg/dL
Specific Gravity, Urine: 1.021 (ref 1.005–1.030)
Urobilinogen, UA: 1 mg/dL (ref 0.0–1.0)
pH: 6 (ref 5.0–8.0)

## 2013-10-02 LAB — APTT: APTT: 37 s (ref 24–37)

## 2013-10-02 LAB — SURGICAL PCR SCREEN
MRSA, PCR: INVALID — AB
Staphylococcus aureus: INVALID — AB

## 2013-10-05 LAB — MRSA CULTURE

## 2013-10-06 NOTE — H&P (Signed)
Lisa [name] is an 43 y.o. female.    Procedure:  Left hip core decompression  Chief Complaint:    Left hip AVN and pain   HPI: Pt is a 43 y.o. female complaining of left hip pain for 2+ years. Pain had continually increased since the beginning. She's been previously diagnosed with AVN of this left hip by MRI. We have been trying to manage it conservatively; however, she's had persistent, recurring problems with this hip pain. We have addressed the soft tissue based treatment with injections in the past without much relief.  Examination finds she walks with a limp, favoring this left hip, sometimes requiring the use of an assistive device to get around. She does have pain with hip flexion, internal rotation. She does not have this on her right hip currently. She is otherwise neurovascularly intact. She continues to have tenderness over the lateral side of her hip.  In the office, repeated plain films, AP pelvis and lateral of the left hip. These did not show any collapse. There is still cystic change within the femoral head.discussed that she would be on the use of a walker or crutches, limited weight bearing for 4-6 weeks after this type of procedure to prevent any complications, twisting mechanisms or subtrochanteric fracture from this.   Risks, benefits and expectations were discussed with the patient.  Risks including but not limited to the risk of anesthesia, blood clots, nerve damage, blood vessel damage, failure of the prosthesis, infection and up to and including death.  Patient understand the risks, benefits and expectations and wishes to proceed with surgery.    PCP: Avon Gully, MD  D/C Plans:      Home with HHPT  Post-op Meds:       No Rx given  Tranexamic Acid:      To be given - IV    Decadron:      Is to be given  FYI:     ASA post-op  Norco post-op   PMH: Past Medical History  Diagnosis Date  . Diabetes mellitus without complication   . Hypertension   .  Avascular necrosis left hip    PSH: Past Surgical History  Procedure Laterality Date  . Abdominal hysterectomy    . Hemorrhoid surgery      Social History:  reports that she has been smoking Cigarettes.  She has a .25 pack-year smoking history. She has never used smokeless tobacco. She reports that she does not drink alcohol or use illicit drugs.  Allergies:  Allergies  Allergen Reactions  . Peanut-Containing Drug Products Anaphylaxis and Hives  . Doxycycline Hives  . Sulfa Antibiotics Hives    Medications: No current facility-administered medications for this encounter.   Current Outpatient Prescriptions  Medication Sig Dispense Refill  . cyclobenzaprine (FLEXERIL) 10 MG tablet Take 1 tablet (10 mg total) by mouth 2 (two) times daily as needed for muscle spasms.  180 tablet  3  . glyBURIDE-metformin (GLUCOVANCE) 2.5-500 MG per tablet Take 1 tablet by mouth 2 (two) times daily with a meal.      . hydrochlorothiazide (MICROZIDE) 12.5 MG capsule Take 12.5 mg by mouth every morning.      Marland Kitchen losartan-hydrochlorothiazide (HYZAAR) 50-12.5 MG per tablet Take 1 tablet by mouth every morning.      . naproxen (NAPROSYN) 500 MG tablet Take 500 mg by mouth 2 (two) times daily with a meal.      . traMADol (ULTRAM) 50 MG tablet Take 50 mg by  mouth every 6 (six) hours as needed for moderate pain.        Review of Systems  Constitutional: Negative.   HENT: Negative.   Eyes: Negative.   Respiratory: Negative.   Cardiovascular: Negative.   Gastrointestinal: Negative.   Genitourinary: Negative.   Musculoskeletal: Positive for joint pain.  Skin: Negative.   Neurological: Negative.   Endo/Heme/Allergies: Negative.   Psychiatric/Behavioral: Negative.       Physical Exam  Constitutional: She is oriented to person, place, and time. She appears well-developed and well-nourished.  HENT:  Head: Normocephalic and atraumatic.  Eyes: Pupils are equal, round, and reactive to light.  Neck: Neck  supple. No JVD present. No tracheal deviation present. No thyromegaly present.  Cardiovascular: Normal rate, regular rhythm, normal heart sounds and intact distal pulses.   Respiratory: Effort normal and breath sounds normal. No respiratory distress. She has no wheezes.  GI: Soft. There is no tenderness. There is no guarding.  Musculoskeletal:       Left hip: She exhibits decreased range of motion, decreased strength, tenderness and bony tenderness. She exhibits no swelling, no deformity and no laceration.  Lymphadenopathy:    She has no cervical adenopathy.  Neurological: She is alert and oriented to person, place, and time.  Skin: Skin is warm and dry.  Psychiatric: She has a normal mood and affect.      Assessment/Plan Assessment:     Left hip ANV and pain   Plan: Patient will undergo a left hip core decompression on 10/09/2013 [facility]. Risks benefits and expectations were discussed with the patient. Patient understand risks, benefits and expectations and wishes to proceed.   Anastasio AuerbachMatthew S. Yamin Swingler   PA-C  10/06/2013, 12:08 PM

## 2013-10-09 ENCOUNTER — Encounter (HOSPITAL_COMMUNITY)
Admission: RE | Disposition: A | Discharge status: Home / Self Care | Payer: Self-pay | Source: Ambulatory Visit | Attending: Orthopedic Surgery

## 2013-10-09 ENCOUNTER — Encounter (HOSPITAL_COMMUNITY): Payer: BC Managed Care – PPO | Admitting: Anesthesiology

## 2013-10-09 ENCOUNTER — Observation Stay (HOSPITAL_COMMUNITY)
Admission: RE | Admit: 2013-10-09 | Discharge: 2013-10-10 | Disposition: A | Discharge status: Home / Self Care | Payer: BC Managed Care – PPO | Source: Ambulatory Visit | Attending: Orthopedic Surgery | Admitting: Orthopedic Surgery

## 2013-10-09 ENCOUNTER — Encounter (HOSPITAL_COMMUNITY): Payer: Self-pay | Admitting: *Deleted

## 2013-10-09 ENCOUNTER — Ambulatory Visit (HOSPITAL_COMMUNITY): Payer: BC Managed Care – PPO

## 2013-10-09 ENCOUNTER — Ambulatory Visit (HOSPITAL_COMMUNITY): Payer: BC Managed Care – PPO | Admitting: Anesthesiology

## 2013-10-09 DIAGNOSIS — M87052 Idiopathic aseptic necrosis of left femur: Secondary | ICD-10-CM

## 2013-10-09 DIAGNOSIS — Z79899 Other long term (current) drug therapy: Secondary | ICD-10-CM | POA: Insufficient documentation

## 2013-10-09 DIAGNOSIS — Z6839 Body mass index (BMI) 39.0-39.9, adult: Secondary | ICD-10-CM | POA: Insufficient documentation

## 2013-10-09 DIAGNOSIS — F17200 Nicotine dependence, unspecified, uncomplicated: Secondary | ICD-10-CM | POA: Insufficient documentation

## 2013-10-09 DIAGNOSIS — E119 Type 2 diabetes mellitus without complications: Secondary | ICD-10-CM | POA: Insufficient documentation

## 2013-10-09 DIAGNOSIS — M87059 Idiopathic aseptic necrosis of unspecified femur: Principal | ICD-10-CM | POA: Diagnosis present

## 2013-10-09 DIAGNOSIS — I10 Essential (primary) hypertension: Secondary | ICD-10-CM | POA: Insufficient documentation

## 2013-10-09 DIAGNOSIS — Z90710 Acquired absence of both cervix and uterus: Secondary | ICD-10-CM | POA: Insufficient documentation

## 2013-10-09 HISTORY — PX: DECOMPRESSION HIP-CORE: SHX5012

## 2013-10-09 LAB — TYPE AND SCREEN
ABO/RH(D): A POS
Antibody Screen: NEGATIVE

## 2013-10-09 LAB — ABO/RH: ABO/RH(D): A POS

## 2013-10-09 LAB — GLUCOSE, CAPILLARY
GLUCOSE-CAPILLARY: 109 mg/dL — AB (ref 70–99)
GLUCOSE-CAPILLARY: 120 mg/dL — AB (ref 70–99)
GLUCOSE-CAPILLARY: 206 mg/dL — AB (ref 70–99)
Glucose-Capillary: 92 mg/dL (ref 70–99)

## 2013-10-09 SURGERY — CORE DECOMPRESSION, FEMUR, HEAD
Anesthesia: Spinal | Site: Hip | Laterality: Left

## 2013-10-09 MED ORDER — METHOCARBAMOL 1000 MG/10ML IJ SOLN
500.0000 mg | Freq: Four times a day (QID) | INTRAVENOUS | Status: DC | PRN
  Administered 2013-10-09: 500 mg via INTRAVENOUS
  Filled 2013-10-09: qty 5

## 2013-10-09 MED ORDER — PROPOFOL 10 MG/ML IV BOLUS
INTRAVENOUS | Status: AC
  Filled 2013-10-09: qty 20

## 2013-10-09 MED ORDER — ONDANSETRON HCL 4 MG/2ML IJ SOLN: 4.0000 mg | Freq: Four times a day (QID) | INTRAMUSCULAR | Status: DC | PRN

## 2013-10-09 MED ORDER — LOSARTAN POTASSIUM 50 MG PO TABS
50.0000 mg | ORAL_TABLET | Freq: Every day | ORAL | Status: DC
  Administered 2013-10-10: 50 mg via ORAL
  Filled 2013-10-09: qty 1

## 2013-10-09 MED ORDER — POLYETHYLENE GLYCOL 3350 17 G PO PACK
17.0000 g | PACK | Freq: Two times a day (BID) | ORAL | Status: DC
  Administered 2013-10-09 – 2013-10-10 (×2): 17 g via ORAL

## 2013-10-09 MED ORDER — BISACODYL 10 MG RE SUPP: 10.0000 mg | Freq: Every day | RECTAL | Status: DC | PRN

## 2013-10-09 MED ORDER — MAGNESIUM CITRATE PO SOLN: 1.0000 | Freq: Once | ORAL | Status: AC | PRN

## 2013-10-09 MED ORDER — LACTATED RINGERS IV SOLN
INTRAVENOUS | Status: DC
  Administered 2013-10-09: 1000 mL via INTRAVENOUS

## 2013-10-09 MED ORDER — PROMETHAZINE HCL 25 MG/ML IJ SOLN: 6.2500 mg | INTRAMUSCULAR | Status: DC | PRN

## 2013-10-09 MED ORDER — MIDAZOLAM HCL 2 MG/2ML IJ SOLN
INTRAMUSCULAR | Status: AC
  Filled 2013-10-09: qty 2

## 2013-10-09 MED ORDER — DEXAMETHASONE SODIUM PHOSPHATE 10 MG/ML IJ SOLN
INTRAMUSCULAR | Status: AC
  Filled 2013-10-09: qty 1

## 2013-10-09 MED ORDER — HYDROCODONE-ACETAMINOPHEN 7.5-325 MG PO TABS
1.0000 | ORAL_TABLET | ORAL | Status: DC
  Administered 2013-10-09: 1 via ORAL
  Administered 2013-10-09 – 2013-10-10 (×4): 2 via ORAL
  Filled 2013-10-09 (×3): qty 2
  Filled 2013-10-09: qty 1
  Filled 2013-10-09: qty 2

## 2013-10-09 MED ORDER — HYDROMORPHONE HCL PF 1 MG/ML IJ SOLN
0.5000 mg | INTRAMUSCULAR | Status: DC | PRN
  Administered 2013-10-09 – 2013-10-10 (×2): 0.5 mg via INTRAVENOUS
  Filled 2013-10-09 (×2): qty 1

## 2013-10-09 MED ORDER — PHENOL 1.4 % MT LIQD
1.0000 | OROMUCOSAL | Status: DC | PRN
  Filled 2013-10-09: qty 177

## 2013-10-09 MED ORDER — METOCLOPRAMIDE HCL 10 MG PO TABS: 5.0000 mg | ORAL_TABLET | Freq: Three times a day (TID) | ORAL | Status: DC | PRN

## 2013-10-09 MED ORDER — ALUM & MAG HYDROXIDE-SIMETH 200-200-20 MG/5ML PO SUSP: 30.0000 mL | ORAL | Status: DC | PRN

## 2013-10-09 MED ORDER — GLYBURIDE 2.5 MG PO TABS
2.5000 mg | ORAL_TABLET | Freq: Two times a day (BID) | ORAL | Status: DC
  Administered 2013-10-10: 2.5 mg via ORAL
  Filled 2013-10-09 (×4): qty 1

## 2013-10-09 MED ORDER — HYDROMORPHONE HCL PF 1 MG/ML IJ SOLN: 0.2500 mg | INTRAMUSCULAR | Status: DC | PRN

## 2013-10-09 MED ORDER — ASPIRIN EC 325 MG PO TBEC
325.0000 mg | DELAYED_RELEASE_TABLET | Freq: Two times a day (BID) | ORAL | Status: DC
  Administered 2013-10-10: 325 mg via ORAL
  Filled 2013-10-09 (×3): qty 1

## 2013-10-09 MED ORDER — FENTANYL CITRATE 0.05 MG/ML IJ SOLN
INTRAMUSCULAR | Status: DC | PRN
  Administered 2013-10-09: 50 ug via INTRAVENOUS
  Administered 2013-10-09 (×2): 25 ug via INTRAVENOUS

## 2013-10-09 MED ORDER — 0.9 % SODIUM CHLORIDE (POUR BTL) OPTIME
TOPICAL | Status: DC | PRN
  Administered 2013-10-09: 1000 mL

## 2013-10-09 MED ORDER — ONDANSETRON HCL 4 MG PO TABS: 4.0000 mg | ORAL_TABLET | Freq: Four times a day (QID) | ORAL | Status: DC | PRN

## 2013-10-09 MED ORDER — MEPERIDINE HCL 50 MG/ML IJ SOLN: 6.2500 mg | INTRAMUSCULAR | Status: DC | PRN

## 2013-10-09 MED ORDER — CEFAZOLIN SODIUM-DEXTROSE 2-3 GM-% IV SOLR
2.0000 g | INTRAVENOUS | Status: AC
  Administered 2013-10-09: 2 g via INTRAVENOUS

## 2013-10-09 MED ORDER — PROPOFOL INFUSION 10 MG/ML OPTIME
INTRAVENOUS | Status: DC | PRN
  Administered 2013-10-09: 180 ug/kg/min via INTRAVENOUS

## 2013-10-09 MED ORDER — FENTANYL CITRATE 0.05 MG/ML IJ SOLN
INTRAMUSCULAR | Status: AC
  Filled 2013-10-09: qty 2

## 2013-10-09 MED ORDER — MENTHOL 3 MG MT LOZG
1.0000 | LOZENGE | OROMUCOSAL | Status: DC | PRN
  Filled 2013-10-09: qty 9

## 2013-10-09 MED ORDER — DIPHENHYDRAMINE HCL 25 MG PO CAPS: 25.0000 mg | ORAL_CAPSULE | Freq: Four times a day (QID) | ORAL | Status: DC | PRN

## 2013-10-09 MED ORDER — METHOCARBAMOL 500 MG PO TABS
500.0000 mg | ORAL_TABLET | Freq: Four times a day (QID) | ORAL | Status: DC | PRN
  Administered 2013-10-10: 500 mg via ORAL
  Filled 2013-10-09: qty 1

## 2013-10-09 MED ORDER — CEFAZOLIN SODIUM-DEXTROSE 2-3 GM-% IV SOLR
2.0000 g | Freq: Four times a day (QID) | INTRAVENOUS | Status: AC
  Administered 2013-10-09 – 2013-10-10 (×2): 2 g via INTRAVENOUS
  Filled 2013-10-09 (×2): qty 50

## 2013-10-09 MED ORDER — MIDAZOLAM HCL 5 MG/5ML IJ SOLN
INTRAMUSCULAR | Status: DC | PRN
  Administered 2013-10-09: 2 mg via INTRAVENOUS
  Administered 2013-10-09 (×2): 1 mg via INTRAVENOUS

## 2013-10-09 MED ORDER — LOSARTAN POTASSIUM-HCTZ 50-12.5 MG PO TABS: 1.0000 | ORAL_TABLET | Freq: Every morning | ORAL | Status: DC

## 2013-10-09 MED ORDER — LIDOCAINE HCL (CARDIAC) 20 MG/ML IV SOLN
INTRAVENOUS | Status: AC
  Filled 2013-10-09: qty 5

## 2013-10-09 MED ORDER — DOCUSATE SODIUM 100 MG PO CAPS
100.0000 mg | ORAL_CAPSULE | Freq: Two times a day (BID) | ORAL | Status: DC
  Administered 2013-10-09 – 2013-10-10 (×2): 100 mg via ORAL

## 2013-10-09 MED ORDER — DEXAMETHASONE SODIUM PHOSPHATE 10 MG/ML IJ SOLN
10.0000 mg | Freq: Once | INTRAMUSCULAR | Status: AC
  Administered 2013-10-09: 10 mg via INTRAVENOUS

## 2013-10-09 MED ORDER — METOCLOPRAMIDE HCL 5 MG/ML IJ SOLN: 5.0000 mg | Freq: Three times a day (TID) | INTRAMUSCULAR | Status: DC | PRN

## 2013-10-09 MED ORDER — SODIUM CHLORIDE 0.9 % IV SOLN
100.0000 mL/h | INTRAVENOUS | Status: DC
  Administered 2013-10-09: 100 mL/h via INTRAVENOUS
  Filled 2013-10-09 (×4): qty 1000

## 2013-10-09 MED ORDER — OXYCODONE HCL 5 MG/5ML PO SOLN
5.0000 mg | Freq: Once | ORAL | Status: DC | PRN
Start: 2013-10-09 — End: 2013-10-09

## 2013-10-09 MED ORDER — HYDROCHLOROTHIAZIDE 12.5 MG PO CAPS
12.5000 mg | ORAL_CAPSULE | Freq: Every morning | ORAL | Status: DC
  Filled 2013-10-09: qty 1

## 2013-10-09 MED ORDER — OXYCODONE HCL 5 MG PO TABS: 5.0000 mg | ORAL_TABLET | Freq: Once | ORAL | Status: DC | PRN

## 2013-10-09 MED ORDER — TRANEXAMIC ACID 100 MG/ML IV SOLN
1000.0000 mg | Freq: Once | INTRAVENOUS | Status: AC
  Administered 2013-10-09: 1000 mg via INTRAVENOUS
  Filled 2013-10-09: qty 10

## 2013-10-09 MED ORDER — FERROUS SULFATE 325 (65 FE) MG PO TABS
325.0000 mg | ORAL_TABLET | Freq: Three times a day (TID) | ORAL | Status: DC
  Administered 2013-10-10: 325 mg via ORAL
  Filled 2013-10-09 (×5): qty 1

## 2013-10-09 MED ORDER — GLYBURIDE-METFORMIN 2.5-500 MG PO TABS: 1.0000 | ORAL_TABLET | Freq: Two times a day (BID) | ORAL | Status: DC

## 2013-10-09 MED ORDER — INSULIN ASPART 100 UNIT/ML ~~LOC~~ SOLN
0.0000 [IU] | Freq: Three times a day (TID) | SUBCUTANEOUS | Status: DC
  Administered 2013-10-10: 2 [IU] via SUBCUTANEOUS

## 2013-10-09 MED ORDER — METFORMIN HCL 500 MG PO TABS
500.0000 mg | ORAL_TABLET | Freq: Two times a day (BID) | ORAL | Status: DC
  Administered 2013-10-10: 500 mg via ORAL
  Filled 2013-10-09 (×4): qty 1

## 2013-10-09 MED ORDER — HYDROCHLOROTHIAZIDE 12.5 MG PO CAPS
12.5000 mg | ORAL_CAPSULE | Freq: Every day | ORAL | Status: DC
  Filled 2013-10-09: qty 1

## 2013-10-09 MED ORDER — DEXAMETHASONE SODIUM PHOSPHATE 10 MG/ML IJ SOLN
10.0000 mg | Freq: Once | INTRAMUSCULAR | Status: DC
  Filled 2013-10-09: qty 1

## 2013-10-09 MED ORDER — LACTATED RINGERS IV SOLN
INTRAVENOUS | Status: DC | PRN
  Administered 2013-10-09 (×2): via INTRAVENOUS

## 2013-10-09 MED ORDER — CEFAZOLIN SODIUM-DEXTROSE 2-3 GM-% IV SOLR
INTRAVENOUS | Status: AC
  Filled 2013-10-09: qty 50

## 2013-10-09 SURGICAL SUPPLY — 41 items
ADH SKN CLS APL DERMABOND .7 (GAUZE/BANDAGES/DRESSINGS) ×1
BAG SPEC THK2 15X12 ZIP CLS (MISCELLANEOUS) ×1
BAG ZIPLOCK 12X15 (MISCELLANEOUS) ×3 IMPLANT
DERMABOND ADVANCED (GAUZE/BANDAGES/DRESSINGS) ×2
DERMABOND ADVANCED .7 DNX12 (GAUZE/BANDAGES/DRESSINGS) ×1 IMPLANT
DRAPE ORTHO SPLIT 77X108 STRL (DRAPES)
DRAPE POUCH INSTRU U-SHP 10X18 (DRAPES) ×3 IMPLANT
DRAPE STERI IOBAN 125X83 (DRAPES) ×3 IMPLANT
DRAPE SURG 17X11 SM STRL (DRAPES) ×3 IMPLANT
DRAPE SURG ORHT 6 SPLT 77X108 (DRAPES) IMPLANT
DRAPE U-SHAPE 47X51 STRL (DRAPES) IMPLANT
DRSG AQUACEL AG ADV 3.5X 4 (GAUZE/BANDAGES/DRESSINGS) ×3 IMPLANT
DRSG AQUACEL AG ADV 3.5X10 (GAUZE/BANDAGES/DRESSINGS) ×3 IMPLANT
DRSG TEGADERM 4X4.75 (GAUZE/BANDAGES/DRESSINGS) IMPLANT
DURAPREP 26ML APPLICATOR (WOUND CARE) ×3 IMPLANT
ELECT REM PT RETURN 9FT ADLT (ELECTROSURGICAL) ×3
ELECTRODE REM PT RTRN 9FT ADLT (ELECTROSURGICAL) ×1 IMPLANT
EVACUATOR 1/8 PVC DRAIN (DRAIN) IMPLANT
GAUZE SPONGE 2X2 8PLY STRL LF (GAUZE/BANDAGES/DRESSINGS) IMPLANT
GLOVE BIOGEL PI IND STRL 7.5 (GLOVE) ×1 IMPLANT
GLOVE BIOGEL PI IND STRL 8 (GLOVE) ×1 IMPLANT
GLOVE BIOGEL PI INDICATOR 7.5 (GLOVE) ×2
GLOVE BIOGEL PI INDICATOR 8 (GLOVE) ×2
GLOVE ECLIPSE 8.0 STRL XLNG CF (GLOVE) IMPLANT
GLOVE ORTHO TXT STRL SZ7.5 (GLOVE) IMPLANT
GOWN SPEC L3 XXLG W/TWL (GOWN DISPOSABLE) ×6 IMPLANT
GOWN STRL REUS W/TWL LRG LVL3 (GOWN DISPOSABLE) ×3 IMPLANT
GUIDEPIN 3.2X17.5 THRD DISP (PIN) ×6 IMPLANT
KIT BASIN OR (CUSTOM PROCEDURE TRAY) ×3 IMPLANT
MANIFOLD NEPTUNE II (INSTRUMENTS) IMPLANT
PACK TOTAL JOINT (CUSTOM PROCEDURE TRAY) ×3 IMPLANT
POSITIONER SURGICAL ARM (MISCELLANEOUS) ×3 IMPLANT
SPONGE GAUZE 2X2 STER 10/PKG (GAUZE/BANDAGES/DRESSINGS)
SUT MNCRL AB 4-0 PS2 18 (SUTURE) ×3 IMPLANT
SUT VIC AB 0 CT1 27 (SUTURE) ×6
SUT VIC AB 0 CT1 27XBRD ANTBC (SUTURE) ×2 IMPLANT
SUT VIC AB 1 CT1 36 (SUTURE) ×3 IMPLANT
SUT VIC AB 2-0 CT1 27 (SUTURE) ×6
SUT VIC AB 2-0 CT1 27XBRD (SUTURE) ×2 IMPLANT
TOWEL OR 17X26 10 PK STRL BLUE (TOWEL DISPOSABLE) ×3 IMPLANT
TRAY FOLEY CATH 14FRSI W/METER (CATHETERS) IMPLANT

## 2013-10-09 NOTE — Brief Op Note (Signed)
10/09/2013  1:55 PM  PATIENT:  Lisa [name]  43 y.o. female  PRE-OPERATIVE DIAGNOSIS:  left hip avascular necrosis  POST-OPERATIVE DIAGNOSIS:  left hip avascular necrosis  PROCEDURE:  Procedure(s): Core DECOMPRESSION LEFT HIP   SURGEON:  Surgeon(s) and Role:    * Shelda PalMatthew D Oval Cavazos, MD - Primary  PHYSICIAN ASSISTANT: Lanney GinsMatthew Babish, PA-C  ANESTHESIA:   general  EBL:     BLOOD ADMINISTERED:none  DRAINS: none   LOCAL MEDICATIONS USED:  NONE  SPECIMEN:  No Specimen  DISPOSITION OF SPECIMEN:  N/A  COUNTS:  YES  TOURNIQUET:  * No tourniquets in log *  DICTATION: .Other Dictation: Dictation Number [id number]  PLAN OF CARE: Admit for overnight observation  PATIENT DISPOSITION:  PACU - hemodynamically stable.   Delay start of Pharmacological VTE agent (>24hrs) due to surgical blood loss or risk of bleeding: not applicable

## 2013-10-09 NOTE — Interval H&P Note (Signed)
History and Physical Interval Note:  10/09/2013 12:39 PM  Lisa [name]  has presented today for surgery, with the diagnosis of left hip AVN  The various methods of treatment have been discussed with the patient and family. After consideration of risks, benefits and other options for treatment, the patient has consented to  Procedure(s): DECOMPRESSION LEFT HIP - CORE (Left) as a surgical intervention .  The patient's history has been reviewed, patient examined, no change in status, stable for surgery.  I have reviewed the patient's chart and labs.  Questions were answered to the patient's satisfaction.     Shelda PalLIN,Niketa Turner D

## 2013-10-09 NOTE — Anesthesia Procedure Notes (Signed)
Spinal  Patient location during procedure: OR Staffing Anesthesiologist: Nickie Retort CRNA/Resident: Sherian Maroon A Performed by: anesthesiologist  Preanesthetic Checklist Completed: patient identified, site marked, surgical consent, pre-op evaluation, timeout performed, IV checked, risks and benefits discussed and monitors and equipment checked Spinal Block Patient position: sitting Prep: Betadine Patient monitoring: heart rate, continuous pulse ox and blood pressure Approach: midline Location: L3-4 Injection technique: single-shot Needle Needle type: Sprotte  Needle gauge: 24 G Needle length: 9 cm Additional Notes Expiration date of kit checked and confirmed. Patient tolerated procedure well, without complications. Attempt by Flynn-Cook. Pt difficult to position.

## 2013-10-09 NOTE — Anesthesia Postprocedure Evaluation (Signed)
Anesthesia Post Note  Patient: Lisa [name]  Procedure(s) Performed: Procedure(s) (LRB): DECOMPRESSION LEFT HIP - CORE (Left)  Anesthesia type: Spinal  Patient location: PACU  Post pain: Pain level controlled  Post assessment: Post-op Vital signs reviewed  Last Vitals: BP 113/77  Pulse 70  Temp(Src) 36.9 C (Oral)  Resp 16  Ht 5\' 6"  (1.676 m)  Wt 247 lb (112.038 kg)  BMI 39.89 kg/m2  SpO2 100%  Post vital signs: Reviewed  Level of consciousness: sedated  Complications: No apparent anesthesia complications

## 2013-10-09 NOTE — Anesthesia Preprocedure Evaluation (Addendum)
Anesthesia Evaluation  Patient identified by MRN, date of birth, ID band Patient awake    Reviewed: Allergy & Precautions, H&P , NPO status , Patient's Chart, lab work & pertinent test results  Airway Mallampati: II TM Distance: >3 FB Neck ROM: Full    Dental no notable dental hx.    Pulmonary Current Smoker,  breath sounds clear to auscultation  Pulmonary exam normal       Cardiovascular hypertension, Pt. on medications Rhythm:Regular Rate:Normal     Neuro/Psych negative neurological ROS  negative psych ROS   GI/Hepatic negative GI ROS, Neg liver ROS,   Endo/Other  diabetes, Type obesity  Renal/GU negative Renal ROS     Musculoskeletal negative musculoskeletal ROS (+)   Abdominal   Peds  Hematology negative hematology ROS (+)   Anesthesia Other Findings   Reproductive/Obstetrics negative OB ROS                          Anesthesia Physical Anesthesia Plan  ASA: III  Anesthesia Plan: Spinal   Post-op Pain Management:    Induction:   Airway Management Planned:   Additional Equipment:   Intra-op Plan:   Post-operative Plan:   Informed Consent: I have reviewed the patients History and Physical, chart, labs and discussed the procedure including the risks, benefits and alternatives for the proposed anesthesia with the patient or authorized representative who has indicated his/her understanding and acceptance.   Dental advisory given  Plan Discussed with: CRNA  Anesthesia Plan Comments:        Anesthesia Quick Evaluation

## 2013-10-09 NOTE — Transfer of Care (Signed)
Immediate Anesthesia Transfer of Care Note  Patient: Lisa [name]  Procedure(s) Performed: Procedure(s): DECOMPRESSION LEFT HIP - CORE (Left)  Patient Location: PACU  Anesthesia Type:Spinal  Level of Consciousness: awake, alert , oriented and patient cooperative  Airway & Oxygen Therapy: Patient Spontanous Breathing and Patient connected to face mask oxygen  Post-op Assessment: Report given to PACU RN and Post -op Vital signs reviewed and stable  Post vital signs: stable  Complications: No apparent anesthesia complications  t4 spinal level

## 2013-10-09 NOTE — Progress Notes (Signed)
Dr. Rica MastFortune made aware of patient's spinal level of T12 in PACU-also made aware of patient's blood pressures- O.K. To go to floor.

## 2013-10-10 ENCOUNTER — Encounter (HOSPITAL_COMMUNITY): Payer: Self-pay | Admitting: Orthopedic Surgery

## 2013-10-10 LAB — BASIC METABOLIC PANEL
Anion gap: 12 (ref 5–15)
BUN: 15 mg/dL (ref 6–23)
CALCIUM: 9.4 mg/dL (ref 8.4–10.5)
CO2: 27 meq/L (ref 19–32)
Chloride: 100 mEq/L (ref 96–112)
Creatinine, Ser: 0.83 mg/dL (ref 0.50–1.10)
GFR calc Af Amer: >90 mL/min (ref >90)
GFR calc non Af Amer: 86 mL/min — ABNORMAL LOW (ref >90)
GLUCOSE: 189 mg/dL — AB (ref 70–99)
Potassium: 3.9 mEq/L (ref 3.7–5.3)
SODIUM: 139 meq/L (ref 137–147)

## 2013-10-10 LAB — CBC
HEMATOCRIT: 35.4 % — AB (ref 36.0–46.0)
HEMOGLOBIN: 11.7 g/dL — AB (ref 12.0–15.0)
MCH: 29.6 pg (ref 26.0–34.0)
MCHC: 33.1 g/dL (ref 30.0–36.0)
MCV: 89.6 fL (ref 78.0–100.0)
Platelets: 205 10*3/uL (ref 150–400)
RBC: 3.95 MIL/uL (ref 3.87–5.11)
RDW: 13 % (ref 11.5–15.5)
WBC: 9.5 10*3/uL (ref 4.0–10.5)

## 2013-10-10 LAB — GLUCOSE, CAPILLARY: GLUCOSE-CAPILLARY: 142 mg/dL — AB (ref 70–99)

## 2013-10-10 MED ORDER — ASPIRIN 325 MG PO TBEC: 325.0000 mg | DELAYED_RELEASE_TABLET | Freq: Two times a day (BID) | ORAL | Status: AC

## 2013-10-10 MED ORDER — POLYETHYLENE GLYCOL 3350 17 G PO PACK: 17.0000 g | PACK | Freq: Two times a day (BID) | ORAL | Status: DC

## 2013-10-10 MED ORDER — HYDROCHLOROTHIAZIDE 25 MG PO TABS
25.0000 mg | ORAL_TABLET | Freq: Every day | ORAL | Status: DC
  Administered 2013-10-10: 25 mg via ORAL
  Filled 2013-10-10: qty 1

## 2013-10-10 MED ORDER — HYDROCODONE-ACETAMINOPHEN 7.5-325 MG PO TABS: 1.0000 | ORAL_TABLET | ORAL | Status: DC | PRN

## 2013-10-10 MED ORDER — DSS 100 MG PO CAPS: 100.0000 mg | ORAL_CAPSULE | Freq: Two times a day (BID) | ORAL | Status: DC

## 2013-10-10 MED ORDER — FERROUS SULFATE 325 (65 FE) MG PO TABS: 325.0000 mg | ORAL_TABLET | Freq: Three times a day (TID) | ORAL | Status: DC

## 2013-10-10 NOTE — Progress Notes (Signed)
Pt to d/[facility] with [facility]. Pt has RW and declines 3-1 commode. AVS reviewed and "My Chart" discussed with pt. Pt capable of verbalizing medications, signs and symptoms of infection, and follow-up appointments. Remains hemodynamically stable. No signs and symptoms of distress. Educated pt to return to ER in the case of SOB, dizziness, or chest pain.

## 2013-10-10 NOTE — Evaluation (Signed)
Occupational Therapy Evaluation Patient Details Name: Lisa [name] MRN: [id number] DOB: 03/28/1970 Today's Date: 10/10/2013    History of Present Illness s/p L core decompression for AVN   Clinical Impression   This 43 year old female was admitted for the above surgery.  All education was completed.  No further OT is needed at this time.      Follow Up Recommendations  No OT follow up    Equipment Recommendations  3 in 1 bedside comode    Recommendations for Other Services       Precautions / Restrictions Precautions Precautions: Fall Restrictions Weight Bearing Restrictions: Yes LLE Weight Bearing: Partial weight bearing LLE Partial Weight Bearing Percentage or Pounds: 50      Mobility Bed Mobility               General bed mobility comments: in recliner  Transfers Overall transfer level: Needs assistance Equipment used: Rolling walker (2 wheeled) Transfers: Sit to/from Stand Sit to Stand: Min guard;Min assist         General transfer comment: cues for UE/LE placement.  More assist from comfort height commode than recliner and 3:1    Balance                                            ADL Overall ADL's : Needs assistance/impaired     Grooming: Supervision/safety;Standing   Upper Body Bathing: Set up;Sitting   Lower Body Bathing: Moderate assistance;Sit to/from stand   Upper Body Dressing : Set up;Sitting   Lower Body Dressing: Sit to/from stand;Moderate assistance   Toilet Transfer: Minimal assistance;Comfort height toilet;Ambulation;Grab bars (min guard from 3:1 over toilet)   Toileting- Clothing Manipulation and Hygiene: Min guard;Sit to/from stand         General ADL Comments: Pt slow to answer questions at times.  Practiced from comfort height commode then 3:1 over it.  She has a standard commode at home.  Recommend 3:1 over commode to increase her independence. Husband will help with adls.  Pt will sponge bathe as  she is PWB and has a tub     Vision                     Perception     Praxis      Pertinent Vitals/Pain "it's OK"  Repositioned and reapplied ice to L hip     Hand Dominance     Extremity/Trunk Assessment Upper Extremity Assessment Upper Extremity Assessment: Overall WFL for tasks assessed   Lower Extremity Assessment Lower Extremity Assessment: LLE deficits/detail LLE Deficits / Details: able to advance LLE       Communication Communication Communication: No difficulties   Cognition Arousal/Alertness: Awake/alert Behavior During Therapy: WFL for tasks assessed/performed Overall Cognitive Status: Within Functional Limits for tasks assessed                     General Comments       Exercises       Shoulder Instructions      Home Living Family/patient expects to be discharged to:: Private residence Living Arrangements: Spouse/significant other Available Help at Discharge: Family Type of Home: Mobile home Home Access: Stairs to enter Secretary/administratorntrance Stairs-Number of Steps: 1         Bathroom Shower/Tub: Chief Strategy OfficerTub/shower unit   Bathroom Toilet: Standard     Home  Equipment: Dan Humphreys - 2 wheels          Prior Functioning/Environment Level of Independence: Independent with assistive device(s)             OT Diagnosis:     OT Problem List:     OT Treatment/Interventions:      OT Goals(Current goals can be found in the care plan section) Acute Rehab OT Goals Patient Stated Goal: to go home  OT Frequency:     Barriers to D/C:            Co-evaluation              End of Session    Activity Tolerance: Patient tolerated treatment well Patient left: in chair;with call bell/phone within reach   Time: 0927-0945 OT Time Calculation (min): 18 min Charges:  OT General Charges $OT Visit: 1 Procedure OT Evaluation $Initial OT Evaluation Tier I: 1 Procedure OT Treatments $Self Care/Home Management : 8-22 mins G-Codes: OT G-codes  **NOT FOR INPATIENT CLASS** Functional Assessment Tool Used: clinical observation and judgment Functional Limitation: Self care Self Care Current Status (U9811): At least 40 percent but less than 60 percent impaired, limited or restricted Self Care Goal Status (B1478): At least 40 percent but less than 60 percent impaired, limited or restricted Self Care Discharge Status [id number]): At least 40 percent but less than 60 percent impaired, limited or restricted  Lisa [name] 10/10/2013, 10:00 AM Marica Otter, OTR/L [phone] 10/10/2013

## 2013-10-10 NOTE — Progress Notes (Signed)
Patient ID: Lisa [name], female   DOB: 07/14/1970, 43 y.o.   MRN: [id number] Subjective: 1 Day Post-Op Procedure(s) (LRB): DECOMPRESSION LEFT HIP - CORE (Left)    Patient reports pain as mild.  Feeling pretty good.  Did OK last night  Objective:   VITALS:   Filed Vitals:   10/10/13 0730  BP:   Pulse:   Temp:   Resp: 18    Neurovascular intact Incision: dressing C/D/I  LABS  Recent Labs  10/10/13 0413  HGB 11.7*  HCT 35.4*  WBC 9.5  PLT 205     Recent Labs  10/10/13 0413  NA 139  K 3.9  BUN 15  CREATININE 0.83  GLUCOSE 189*    No results found for this basename: LABPT, INR,  in the last 72 hours   Assessment/Plan: 1 Day Post-Op Procedure(s) (LRB): DECOMPRESSION LEFT HIP - CORE (Left)   [facility] diet Up with therapy, PWB LLE  D/C to home after therapy evaluates her today

## 2013-10-10 NOTE — Op Note (Signed)
NAMMassie Bougie:  Lisa [name], Lisa [name]               ACCOUNT NO.:  [id number]  MEDICAL RECORD NO.:  [id number]  LOCATION:  1611                         [facility]  PHYSICIAN:  Madlyn FrankelMatthew D. Charlann Boxerlin, M.D.  DATE OF BIRTH:  Aug 10, 1970  DATE OF PROCEDURE:  10/09/2013 DATE OF DISCHARGE:                              OPERATIVE REPORT   PREOPERATIVE DIAGNOSIS:  Left hip avascular necrosis.  POSTOPERATIVE DIAGNOSIS:  Left hip avascular necrosis.  PROCEDURE:  Core decompression, left hip.  SURGEON:  Madlyn FrankelMatthew D. Charlann Boxerlin, MD  ASSISTANT:  Lanney GinsMatthew Babish, PA-C.  [name] was present for the entirety of the case from preoperative position, perioperative assistance with guide placement, and reaming, as well as primary wound closure, general facilitation of the case.  ANESTHESIA:  Spinal.  SPECIMENS:  None.  COMPLICATION:  None apparent.  BLOOD LOSS:  Less than 25 mL.  INDICATIONS FOR PROCEDURE:  Lisa Landngela is a 43 year old female who had presented to the office for evaluation of left hip pain.  Workup radiographically raised concerns about avascular changes which was confirmed by MRI.  Based on initial discussion of core decompression, she decided to wait, but presented and had followup MRI which revealed persistent avascular change with significant edematous change in the femoral head and neck.  There was no evidence on her lateral radiograph of any collapse at 43 years of age.  I discussed with her that I would like to try to salvage the joint as much as possible.  Based on the fact that, her age, the findings of radiographs, we discussed the risks of recurrence or persistent avascular changes of bone, the potential for further collapse, and progressive avascular necrosis and ultimately a total hip arthroplasty.  Standard risk of infection, DVT were also discussed.  Postoperative course was reviewed including limited weightbearing for 4-6 weeks to allow for bony healing to prevent any potential torsional  injury on her femur at the site of core.  PROCEDURE IN DETAIL:  The patient was brought to operative theater. Once adequate anesthesia, preoperative antibiotics administered, patient was positioned supine on the fracture table.  Her left foot was placed in traction boot with no traction applied.  The right foot was positioned into the well leg holder with bony prominences padded.  Fluoroscopy was brought into the field to confirm orientation prior to draping.  The left lower extremity was then prepped and draped in sterile fashion using shower curtain technique from the iliac crest to the distal thigh. Time-out was performed identifying the patient, planned procedure, and extremity.  Fluoroscopy was brought back to the field to confirm orientation landmarks.  I made a small 2-3 cm incision over the lateral aspect.  We had to place a guidewire.  Guidewire was positioned and confirmed in orientation, AP and lateral planes radiographically orientation, once I had placed the guide pin into the anterior lateral aspect into the hip, I drilled for the first time with Affixus reamer for lag screw.  Based on the extent of her avascular changes noted by MRI, I reoriented the guidewire 3 other times and passed this drill off from the same site laterally on the proximal femur.  I felt that this would provide the  best stability for decompression.  I did obtain several x-rays, confirmed that there would be no penetration to the guidewire or the reamer using fluoroscopy under the fluoro mode as opposed to spot while reaming.  She was noted to have very sclerotic bone consistent with these avascular changes.  Upon completion of the procedure, I irrigated the wound, I then reapproximated the skin with 2-0 Vicryl and a running Monocryl suture. The hip was cleaned, dried, and dressed sterilely with an Aquacel dressing and Dermabond.  She tolerated it well.  Findings reviewed with her husband, and  she should stay in the hospital overnight.  She will remain partial weightbearing for 4-6 weeks.     Madlyn Frankel Charlann Boxer, M.D.     MDO/MEDQ  D:  10/09/2013  T:  10/10/2013  Job:  [id number]

## 2013-10-10 NOTE — Evaluation (Signed)
Physical Therapy Evaluation Patient Details Name: Lisa [name] MRN: [id number] DOB: 08/19/1970 Today's Date: 10/10/2013   History of Present Illness  S/p core cecompression L hip for AVN  Clinical Impression  Pt tolerated very well . Plans Dc today after PT. Pt will benefit from PT to address problems listed in note below to return home    Follow Up Recommendations Home health PT    Equipment Recommendations  None recommended by PT    Recommendations for Other Services       Precautions / Restrictions Restrictions Weight Bearing Restrictions: Yes LLE Weight Bearing: Partial weight bearing LLE Partial Weight Bearing Percentage or Pounds: 50      Mobility  Bed Mobility               General bed mobility comments: in recliner  Transfers Overall transfer level: Needs assistance Equipment used: Rolling walker (2 wheeled) Transfers: Sit to/from Stand Sit to Stand: Min guard         General transfer comment: cues for hand and LLE position  Ambulation/Gait Ambulation/Gait assistance: Min guard Ambulation Distance (Feet): 200 Feet Assistive device: Rolling walker (2 wheeled) Gait Pattern/deviations: Step-through pattern     General Gait Details: cues for [facility]  Stairs            Wheelchair Mobility    Modified Rankin (Stroke Patients Only)       Balance                                             Pertinent Vitals/Pain No pain    Home Living Family/patient expects to be discharged to:: Private residence Living Arrangements: Spouse/significant other Available Help at Discharge: Family Type of Home: Mobile home Home Access: Stairs to enter   Secretary/administratorntrance Stairs-Number of Steps: 1   Home Equipment: Environmental consultantWalker - 2 wheels      Prior Function Level of Independence: Independent with assistive device(s)               Hand Dominance        Extremity/Trunk Assessment   Upper Extremity Assessment: Overall WFL for tasks  assessed           Lower Extremity Assessment: LLE deficits/detail   LLE Deficits / Details: able to advance LLE     Communication   Communication: No difficulties  Cognition Arousal/Alertness: Awake/alert Behavior During Therapy: WFL for tasks assessed/performed Overall Cognitive Status: Within Functional Limits for tasks assessed                      General Comments      Exercises        Assessment/Plan    PT Assessment Patient needs continued PT services  PT Diagnosis Difficulty walking   PT Problem List Decreased range of motion;Decreased activity tolerance;Decreased mobility;Decreased safety awareness;Decreased knowledge of use of DME;Decreased knowledge of precautions  PT Treatment Interventions DME instruction;Gait training;Stair training;Functional mobility training;Therapeutic activities;Therapeutic exercise;Patient/family education   PT Goals (Current goals can be found in the Care Plan section) Acute Rehab PT Goals Patient Stated Goal: to go home PT Goal Formulation: With patient/family Time For Goal Achievement: 10/12/13 Potential to Achieve Goals: Good    Frequency 7X/week   Barriers to discharge        Co-evaluation  End of Session   Activity Tolerance: Patient tolerated treatment well Patient left: in chair;with call bell/phone within reach Nurse Communication: Mobility status    Functional Assessment Tool Used: clinical judgement Functional Limitation: Mobility: Walking and moving around Mobility: Walking and Moving Around Current Status ([id number]): At least 20 percent but less than 40 percent impaired, limited or restricted Mobility: Walking and Moving Around Goal Status [phone]): At least 1 percent but less than 20 percent impaired, limited or restricted    Time: 0859-0911 PT Time Calculation (min): 12 min   Charges:   PT Evaluation $Initial PT Evaluation Tier I: 1 Procedure PT Treatments $Gait Training:  8-22 mins   PT G Codes:   Functional Assessment Tool Used: clinical judgement Functional Limitation: Mobility: Walking and moving around    Lisa [name] 10/10/2013, 9:19 AM [facility] PT [phone]

## 2013-10-10 NOTE — Progress Notes (Signed)
Utilization review completed.  

## 2013-10-10 NOTE — Progress Notes (Signed)
Physical Therapy Treatment Patient Details Name: Lisa [name] MRN: [id number] DOB: 01-23-1971 Today's Date: 10/10/2013    History of Present Illness s/p L core decompression for AVN    PT Comments    Progressed well.  Follow Up Recommendations  Home health PT     Equipment Recommendations  None recommended by PT    Recommendations for Other Services       Precautions / Restrictions Precautions Precautions: Fall Restrictions Weight Bearing Restrictions: Yes LLE Weight Bearing: Partial weight bearing LLE Partial Weight Bearing Percentage or Pounds: 50    Mobility  Bed Mobility               General bed mobility comments: in recliner  Transfers Overall transfer level: Needs assistance Equipment used: Rolling walker (2 wheeled) Transfers: Sit to/from Stand Sit to Stand: Supervision         General transfer comment: cues for UE/LE placement.  More assist from comfort height commode than recliner and 3:1  Ambulation/Gait Ambulation/Gait assistance: Min guard Ambulation Distance (Feet): 200 Feet Assistive device: Rolling walker (2 wheeled) Gait Pattern/deviations: Step-through pattern     General Gait Details: cues for PWB   Stairs Stairs: Yes Stairs assistance: Min guard Stair Management: No rails;Step to pattern;Backwards;With walker Number of Stairs: 1 General stair comments: practiced x 2  Wheelchair Mobility    Modified Rankin (Stroke Patients Only)       Balance                                    Cognition Arousal/Alertness: Awake/alert Behavior During Therapy: WFL for tasks assessed/performed Overall Cognitive Status: Within Functional Limits for tasks assessed                      Exercises Total Joint Exercises Quad Sets: AROM;Both;10 reps Short Arc Quad: AROM;Left;10 reps Heel Slides: AROM;Left;10 reps Hip ABduction/ADduction: AROM;Left;10 reps Long Arc Quad: AROM;Left;10 reps    General  Comments        Pertinent Vitals/Pain No pain    Home Living Family/patient expects to be discharged to:: Private residence Living Arrangements: Spouse/significant other Available Help at Discharge: Family Type of Home: Mobile home Home Access: Stairs to enter     Home Equipment: Environmental consultant - 2 wheels      Prior Function Level of Independence: Independent with assistive device(s)          PT Goals (current goals can now be found in the care plan section) Acute Rehab PT Goals Patient Stated Goal: to go home PT Goal Formulation: With patient/family Time For Goal Achievement: 10/12/13 Potential to Achieve Goals: Good Progress towards PT goals: Progressing toward goals    Frequency  7X/week    PT Plan Current plan remains appropriate    Co-evaluation             End of Session   Activity Tolerance: Patient tolerated treatment well Patient left: in chair;with call bell/phone within reach     Time: 1103-1120 PT Time Calculation (min): 17 min  Charges:  $Gait Training: 8-22 mins                    G Codes:  Functional Assessment Tool Used: clinical judgement Functional Limitation: Mobility: Walking and moving around Mobility: Walking and Moving Around Current Status ([id number]): At least 20 percent but less than 40 percent impaired, limited or restricted Mobility: Walking  and Moving Around Goal Status [phone]([id number]): At least 1 percent but less than 20 percent impaired, limited or restricted Mobility: Walking and Moving Around Discharge Status [phone]([id number]): At least 1 percent but less than 20 percent impaired, limited or restricted   Rada HayHill, Adamarys Shall Elizabeth 10/10/2013, 11:51 AM

## 2013-10-10 NOTE — Progress Notes (Signed)
[facility] the [facility]: Patient declined rw and commode - has rw at home and did not feel that she needed the commode.    If patient discharges after hours, please call [phone].   Renard HamperLecretia Williamson 10/10/2013, 9:28 AM

## 2013-10-11 NOTE — Discharge Summary (Signed)
Physician Discharge Summary  Patient ID: Lisa [name] MRN: [id number] DOB/AGE: 10/18/1970 43 y.o.  Admit date: 10/09/2013 Discharge date: 10/10/2013   Procedures:  Procedure(s) (LRB): DECOMPRESSION LEFT HIP - CORE (Left)  Attending Physician:  Dr. Durene Romans   Admission Diagnoses:   Left hip AVN and pain   Discharge Diagnoses:  Principal Problem:   Left hip AVN  Past Medical History  Diagnosis Date  . Diabetes mellitus without complication   . Hypertension   . Avascular necrosis left hip    HPI: Pt is a 43 y.o. female complaining of left hip pain for 2+ years. Pain had continually increased since the beginning. She's been previously diagnosed with AVN of this left hip by MRI. We have been trying to manage it conservatively; however, she's had persistent, recurring problems with this hip pain. We have addressed the soft tissue based treatment with injections in the past without much relief. Examination finds she walks with a limp, favoring this left hip, sometimes requiring the use of an assistive device to get around. She does have pain with hip flexion, internal rotation. She does not have this on her right hip currently. She is otherwise neurovascularly intact. She continues to have tenderness over the lateral side of her hip. In the office, repeated plain films, AP pelvis and lateral of the left hip. These did not show any collapse. There is still cystic change within the femoral head. discussed that she would be on the use of a walker or crutches, limited weight bearing for 4-6 weeks after this type of procedure to prevent any complications, twisting mechanisms or subtrochanteric fracture from this. Risks, benefits and expectations were discussed with the patient. Risks including but not limited to the risk of anesthesia, blood clots, nerve damage, blood vessel damage, failure of the prosthesis, infection and up to and including death. Patient understand the risks, benefits and  expectations and wishes to proceed with surgery.   PCP: Avon Gully, MD   Discharged Condition: good  Hospital Course:  Patient underwent the above stated procedure on 10/09/2013. Patient tolerated the procedure well and brought to the recovery room in good condition and subsequently to the floor.  POD #1 BP: 116/72 ; Pulse: 57 ; Temp: 97.7 F (36.5 C) ; Resp: 16  Patient reports pain as mild. Feeling pretty good. Did OK last night. Ready to be discharged home. Dorsiflexion/plantar flexion intact, incision: dressing C/D/I, no cellulitis present and compartment soft.   LABS  Basename    HGB  11.7  HCT  35.4    Discharge Exam: General appearance: alert, cooperative and no distress Extremities: Homans sign is negative, no sign of DVT, no edema, redness or tenderness in the calves or thighs and no ulcers, gangrene or trophic changes  Disposition:   Home with follow up in 2 weeks   Follow-up Information   Follow up with Shelda Pal, MD. Schedule an appointment as soon as possible for a visit in 2 weeks.   Specialty:  Orthopedic Surgery   Contact information:   [address] [place] [address] [phone]       Discharge Instructions   Call MD / Call 911    Complete by:  As directed   If you experience chest pain or shortness of breath, CALL 911 and be transported to the hospital emergency room.  If you develope a fever above 101 F, pus (white drainage) or increased drainage or redness at the wound, or calf pain, call your  surgeon's office.     Change dressing    Complete by:  As directed   Maintain surgical dressing for 10-14 days, or until follow up in the clinic.     Constipation Prevention    Complete by:  As directed   Drink plenty of fluids.  Prune juice may be helpful.  You may use a stool softener, such as Colace (over the counter) 100 mg twice a day.  Use MiraLax (over the counter) for constipation as needed.     Diet - low sodium heart  healthy    Complete by:  As directed      Discharge instructions    Complete by:  As directed   Maintain surgical dressing for 10-14 days, or until follow up in the clinic. [facility] Call with any questions or concerns.     Partial weight bearing    Complete by:  As directed   % Body Weight:  25-50  Laterality:  left  Extremity:  Lower     TED hose    Complete by:  As directed   Use stockings (TED hose) for 2 weeks on both leg(s).  You may remove them at night for sleeping.             Medication List    STOP taking these medications       naproxen 500 MG tablet  Commonly known as:  NAPROSYN     traMADol 50 MG tablet  Commonly known as:  ULTRAM      TAKE these medications       aspirin 325 MG EC tablet  Take 1 tablet (325 mg total) by mouth 2 (two) times daily.     cyclobenzaprine 10 MG tablet  Commonly known as:  FLEXERIL  Take 1 tablet (10 mg total) by mouth 2 (two) times daily as needed for muscle spasms.     DSS 100 MG Caps  Take 100 mg by mouth 2 (two) times daily.     ferrous sulfate 325 (65 FE) MG tablet  Take 1 tablet (325 mg total) by mouth 3 (three) times daily after meals.     glyBURIDE-metformin 2.5-500 MG per tablet  Commonly known as:  GLUCOVANCE  Take 1 tablet by mouth 2 (two) times daily with a meal.     hydrochlorothiazide 12.5 MG capsule  Commonly known as:  MICROZIDE  Take 12.5 mg by mouth every morning.     HYDROcodone-acetaminophen 7.5-325 MG per tablet  Commonly known as:  NORCO  Take 1-2 tablets by mouth every 4 (four) hours as needed for moderate pain.     losartan-hydrochlorothiazide 50-12.5 MG per tablet  Commonly known as:  HYZAAR  Take 1 tablet by mouth every morning.     polyethylene glycol packet  Commonly known as:  MIRALAX / GLYCOLAX  Take 17 g by mouth 2 (two) times daily.         Signed: Anastasio AuerbachMatthew S. Aryka Coonradt   PA-C  10/11/2013, 12:28 PM

## 2014-02-21 NOTE — H&P (Signed)
TOTAL HIP ADMISSION H&P  Patient is admitted for left total hip arthroplasty, anterior approach.  Subjective:  Chief Complaint:    Left hip primary OA / pain  HPI: Lisa [name], 43 y.o. female, has a history of pain and functional disability in the left hip(s) due to arthritis and patient has failed non-surgical conservative treatments for greater than 12 weeks to include NSAID's and/or analgesics, corticosteriod injections, use of assistive devices and activity modification.  Onset of symptoms was gradual starting 1+ years ago with gradually worsening course since that time.The patient noted prior procedures of the hip to include core decompression on the left hip on 10/09/2013 by Dr. Charlann Boxerlin.  Patient currently rates pain in the left hip at 10 out of 10 with activity. Patient has night pain, worsening of pain with activity and weight bearing, trendelenberg gait, pain that interfers with activities of daily living and pain with passive range of motion. Patient has evidence of periarticular osteophytes and joint space narrowing by imaging studies. This condition presents safety issues increasing the risk of falls.   There is no current active infection.  Risks, benefits and expectations were discussed with the patient.  Risks including but not limited to the risk of anesthesia, blood clots, nerve damage, blood vessel damage, failure of the prosthesis, infection and up to and including death.  Patient understand the risks, benefits and expectations and wishes to proceed with surgery.   PCP: Avon GullyFANTA,TESFAYE, MD  D/C Plans:      Home with HHPT  Post-op Meds:       No Rx given  Tranexamic Acid:      To be given - IV   Decadron:      Is to be given  FYI:     ASA  Norco    Patient Active Problem List   Diagnosis Date Noted  . Left hip AVN 10/09/2013  . Osteonecrosis of hip 05/30/2013   Past Medical History  Diagnosis Date  . Diabetes mellitus without complication   . Hypertension   .  Avascular necrosis left hip    Past Surgical History  Procedure Laterality Date  . Abdominal hysterectomy    . Hemorrhoid surgery    . Decompression hip-core Left 10/09/2013    Procedure: DECOMPRESSION LEFT HIP - CORE;  Surgeon: Shelda PalMatthew D Olin, MD;  Location: [facility];  Service: Orthopedics;  Laterality: Left;    No prescriptions prior to admission   Allergies  Allergen Reactions  . Peanut-Containing Drug Products Anaphylaxis and Hives  . Doxycycline Hives  . Sulfa Antibiotics Hives    History  Substance Use Topics  . Smoking status: Current Every Day Smoker -- 0.25 packs/day for 1 years    Types: Cigarettes  . Smokeless tobacco: Never Used  . Alcohol Use: No    No family history on file.   Review of Systems  Constitutional: Negative.   HENT: Negative.   Eyes: Negative.   Respiratory: Negative.   Cardiovascular: Negative.   Gastrointestinal: Negative.   Genitourinary: Negative.   Musculoskeletal: Positive for joint pain.  Skin: Negative.   Neurological: Negative.   Endo/Heme/Allergies: Negative.   Psychiatric/Behavioral: Negative.     Objective:  Physical Exam  Constitutional: She is oriented to person, place, and time. She appears well-developed and well-nourished.  HENT:  Head: Normocephalic and atraumatic.  Eyes: Pupils are equal, round, and reactive to light.  Neck: Neck supple. No JVD present. No tracheal deviation present. No thyromegaly present.  Cardiovascular: Normal rate, regular rhythm,  normal heart sounds and intact distal pulses.   Respiratory: Effort normal and breath sounds normal. No stridor. No respiratory distress. She has no wheezes.  GI: Soft. There is no tenderness. There is no guarding.  Musculoskeletal:       Left hip: She exhibits decreased range of motion, decreased strength, tenderness and bony tenderness. She exhibits no swelling, no deformity and no laceration.  Lymphadenopathy:    She has no cervical adenopathy.  Neurological: She is  alert and oriented to person, place, and time.  Skin: Skin is warm and dry.  Psychiatric: She has a normal mood and affect.       Labs:  Estimated body mass index is 40.01 kg/(m^2) as calculated from the following:   Height as of 10/09/13: 5\' 6"  (1.676 m).   Weight as of 10/02/13: 112.401 kg (247 lb 12.8 oz).   Imaging Review Plain radiographs demonstrate severe degenerative joint disease of the left hip(s). The bone quality appears to be good for age and reported activity level.  Assessment/Plan:  End stage arthritis, left hip(s)  The patient history, physical examination, clinical judgement of the provider and imaging studies are consistent with end stage degenerative joint disease of the left hip(s) and total hip arthroplasty is deemed medically necessary. The treatment options including medical management, injection therapy, arthroscopy and arthroplasty were discussed at length. The risks and benefits of total hip arthroplasty were presented and reviewed. The risks due to aseptic loosening, infection, stiffness, dislocation/subluxation,  thromboembolic complications and other imponderables were discussed.  The patient acknowledged the explanation, agreed to proceed with the plan and consent was signed. Patient is being admitted for inpatient treatment for surgery, pain control, PT, OT, prophylactic antibiotics, VTE prophylaxis, progressive ambulation and ADL's and discharge planning.The patient is planning to be discharged home with home health services.      Anastasio AuerbachMatthew S. Muhammed Teutsch   PA-C  02/21/2014, 10:38 PM

## 2014-02-28 ENCOUNTER — Encounter (HOSPITAL_COMMUNITY): Payer: Self-pay

## 2014-02-28 ENCOUNTER — Encounter (HOSPITAL_COMMUNITY)
Admission: RE | Admit: 2014-02-28 | Discharge: 2014-02-28 | Disposition: A | Discharge status: Home / Self Care | Payer: BC Managed Care – PPO | Source: Ambulatory Visit | Attending: Orthopaedic Surgery | Admitting: Orthopaedic Surgery

## 2014-02-28 DIAGNOSIS — Z01812 Encounter for preprocedural laboratory examination: Secondary | ICD-10-CM | POA: Insufficient documentation

## 2014-02-28 HISTORY — DX: Unspecified osteoarthritis, unspecified site: M19.90

## 2014-02-28 LAB — BASIC METABOLIC PANEL
Anion gap: 13 (ref 5–15)
BUN: 16 mg/dL (ref 6–23)
CO2: 28 meq/L (ref 19–32)
Calcium: 10.3 mg/dL (ref 8.4–10.5)
Chloride: 101 mEq/L (ref 96–112)
Creatinine, Ser: 0.82 mg/dL (ref 0.50–1.10)
GFR calc Af Amer: >90 mL/min (ref >90)
GFR calc non Af Amer: 86 mL/min — ABNORMAL LOW (ref >90)
Glucose, Bld: 161 mg/dL — ABNORMAL HIGH (ref 70–99)
Potassium: 3.7 mEq/L (ref 3.7–5.3)
SODIUM: 142 meq/L (ref 137–147)

## 2014-02-28 LAB — CBC
HCT: 40.9 % (ref 36.0–46.0)
Hemoglobin: 12.7 g/dL (ref 12.0–15.0)
MCH: 28.3 pg (ref 26.0–34.0)
MCHC: 31.1 g/dL (ref 30.0–36.0)
MCV: 91.1 fL (ref 78.0–100.0)
Platelets: 257 10*3/uL (ref 150–400)
RBC: 4.49 MIL/uL (ref 3.87–5.11)
RDW: 13.6 % (ref 11.5–15.5)
WBC: 4.4 10*3/uL (ref 4.0–10.5)

## 2014-02-28 LAB — APTT: aPTT: 40 seconds — ABNORMAL HIGH (ref 24–37)

## 2014-02-28 LAB — PROTIME-INR
INR: 1.03 (ref 0.00–1.49)
Prothrombin Time: 13.7 seconds (ref 11.6–15.2)

## 2014-02-28 LAB — URINALYSIS, ROUTINE W REFLEX MICROSCOPIC
Bilirubin Urine: NEGATIVE
Glucose, UA: NEGATIVE mg/dL
Hgb urine dipstick: NEGATIVE
Ketones, ur: NEGATIVE mg/dL
Leukocytes, UA: NEGATIVE
Nitrite: NEGATIVE
Protein, ur: NEGATIVE mg/dL
Specific Gravity, Urine: 1.024 (ref 1.005–1.030)
Urobilinogen, UA: 1 mg/dL (ref 0.0–1.0)
pH: 5.5 (ref 5.0–8.0)

## 2014-02-28 LAB — SURGICAL PCR SCREEN
MRSA, PCR: NEGATIVE
Staphylococcus aureus: NEGATIVE

## 2014-02-28 NOTE — Patient Instructions (Signed)
20 Lisa [name]  02/28/2014   Your procedure is scheduled on:   02-24-2014 Tuesday  [facility] and [facility]. Arrive at 0630       AM ..  Call this number if you have problems the morning of surgery: [phone]  Or Presurgical Testing [phone].   For Living Will and/or Health Care Power Attorney Forms: please provide copy for your medical record,may bring AM of surgery(Forms should be already notarized -we do not provide this service).(02-28-14  No information preferred today).     Do not eat food/ or drink: After Midnight.      Take these medicines the morning of surgery with A SIP OF WATER: Hydrocodone. Tylenol if needed. No diabetic meds or Blood pressure meds AM of   Do not wear jewelry, make-up or nail polish.  Do not wear deodorant, lotions, powders, or perfumes.   Do not shave legs and under arms- 48 hours(2 days) prior to first CHG shower.(Shaving face and neck okay.)  Do not bring valuables to the hospital.(Hospital is not responsible for lost valuables).  Contacts, dentures or removable bridgework, body piercing, hair pins may not be worn into surgery.  Leave suitcase in the car. After surgery it may be brought to your room.  For patients admitted to the hospital, checkout time is 11:00 AM the day of discharge.(Restricted visitors-Any Persons displaying flu-like symptoms or illness).    Patients discharged the day of surgery will not be allowed to drive home. Must have responsible person with you x 24 hours once discharged.  Name and phone number of your driver: Dorene SorrowJerry -spouse [phone] cell      Please read over the following fact sheets that you were given:  CHG(Chlorhexidine Gluconate 4% Surgical Soap) use, MRSA Information, Blood Transfusion fact sheet, Incentive Spirometry Instruction.  Remember : Type/Screen "Blue armbands" - may not be removed once applied(would result in being retested AM of surgery,  if removed).         [facility] - Preparing for Surgery Before surgery, you can play an important role.  Because skin is not sterile, your skin needs to be as free of germs as possible.  You can reduce the number of germs on your skin by washing with CHG (chlorahexidine gluconate) soap before surgery.  CHG is an antiseptic cleaner which kills germs and bonds with the skin to continue killing germs even after washing. Please DO NOT use if you have an allergy to CHG or antibacterial soaps.  If your skin becomes reddened/irritated stop using the CHG and inform your nurse when you arrive at Short Stay. Do not shave (including legs and underarms) for at least 48 hours prior to the first CHG shower.  You may shave your face/neck. Please follow these instructions carefully:  1.  Shower with CHG Soap the night before surgery and the  morning of Surgery.  2.  If you choose to wash your hair, wash your hair first as usual with your  normal  shampoo.  3.  After you shampoo, rinse your hair and body thoroughly to remove the  shampoo.                           4.  Use CHG as you would any other liquid soap.  You can apply chg directly  to the skin and wash  Gently with a scrungie or clean washcloth.  5.  Apply the CHG Soap to your body ONLY FROM THE NECK DOWN.   Do not use on face/ open                           Wound or open sores. Avoid contact with eyes, ears mouth and genitals (private parts).                       Wash face,  Genitals (private parts) with your normal soap.             6.  Wash thoroughly, paying special attention to the area where your surgery  will be performed.  7.  Thoroughly rinse your body with warm water from the neck down.  8.  DO NOT shower/wash with your normal soap after using and rinsing off  the CHG Soap.                9.  Pat yourself dry with a clean towel.            10.  Wear clean pajamas.            11.  Place clean sheets on your bed the night  of your first shower and do not  sleep with pets. Day of Surgery : Do not apply any lotions/deodorants the morning of surgery.  Please wear clean clothes to the hospital/surgery center.  FAILURE TO FOLLOW THESE INSTRUCTIONS MAY RESULT IN THE CANCELLATION OF YOUR SURGERY PATIENT SIGNATURE_________________________________  NURSE SIGNATURE__________________________________  ________________________________________________________________________   Adam Phenix  An incentive spirometer is a tool that can help keep your lungs clear and active. This tool measures how well you are filling your lungs with each breath. Taking long deep breaths may help reverse or decrease the chance of developing breathing (pulmonary) problems (especially infection) following:  A long period of time when you are unable to move or be active. BEFORE THE PROCEDURE   If the spirometer includes an indicator to show your best effort, your nurse or respiratory therapist will set it to a desired goal.  If possible, sit up straight or lean slightly forward. Try not to slouch.  Hold the incentive spirometer in an upright position. INSTRUCTIONS FOR USE   Sit on the edge of your bed if possible, or sit up as far as you can in bed or on a chair.  Hold the incentive spirometer in an upright position.  Breathe out normally.  Place the mouthpiece in your mouth and seal your lips tightly around it.  Breathe in slowly and as deeply as possible, raising the piston or the ball toward the top of the column.  Hold your breath for 3-5 seconds or for as long as possible. Allow the piston or ball to fall to the bottom of the column.  Remove the mouthpiece from your mouth and breathe out normally.  Rest for a few seconds and repeat Steps 1 through 7 at least 10 times every 1-2 hours when you are awake. Take your time and take a few normal breaths between deep breaths.  The spirometer may include an indicator to show  your best effort. Use the indicator as a goal to work toward during each repetition.  After each set of 10 deep breaths, practice coughing to be sure your lungs are clear. If you have an incision (the cut made at the time of  surgery), support your incision when coughing by placing a pillow or rolled up towels firmly against it. Once you are able to get out of bed, walk around indoors and cough well. You may stop using the incentive spirometer when instructed by your caregiver.  RISKS AND COMPLICATIONS  Take your time so you do not get dizzy or light-headed.  If you are in pain, you may need to take or ask for pain medication before doing incentive spirometry. It is harder to take a deep breath if you are having pain. AFTER USE  Rest and breathe slowly and easily.  It can be helpful to keep track of a log of your progress. Your caregiver can provide you with a simple table to help with this. If you are using the spirometer at home, follow these instructions: [place] IF:   You are having difficultly using the spirometer.  You have trouble using the spirometer as often as instructed.  Your pain medication is not giving enough relief while using the spirometer.  You develop fever of 100.5 F (38.1 C) or higher. SEEK IMMEDIATE MEDICAL CARE IF:   You cough up bloody sputum that had not been present before.  You develop fever of 102 F (38.9 C) or greater.  You develop worsening pain at or near the incision site. MAKE SURE YOU:   Understand these instructions.  Will watch your condition.  Will get help right away if you are not doing well or get worse. Document Released: 07/13/2006 Document Revised: 05/25/2011 Document Reviewed: 09/13/2006 ExitCare Patient Information 2014 ExitCare, [place].   ________________________________________________________________________  WHAT IS A BLOOD TRANSFUSION? Blood Transfusion Information  A transfusion is the replacement of blood  or some of its parts. Blood is made up of multiple cells which provide different functions.  Red blood cells carry oxygen and are used for blood loss replacement.  White blood cells fight against infection.  Platelets control bleeding.  Plasma helps clot blood.  Other blood products are available for specialized needs, such as hemophilia or other clotting disorders. BEFORE THE TRANSFUSION  Who gives blood for transfusions?   Healthy volunteers who are fully evaluated to make sure their blood is safe. This is blood bank blood. Transfusion therapy is the safest it has ever been in the practice of medicine. Before blood is taken from a donor, a complete history is taken to make sure that person has no history of diseases nor engages in risky social behavior (examples are intravenous drug use or sexual activity with multiple partners). The donor's travel history is screened to minimize risk of transmitting infections, such as malaria. The donated blood is tested for signs of infectious diseases, such as HIV and hepatitis. The blood is then tested to be sure it is compatible with you in order to minimize the chance of a transfusion reaction. If you or a relative donates blood, this is often done in anticipation of surgery and is not appropriate for emergency situations. It takes many days to process the donated blood. RISKS AND COMPLICATIONS Although transfusion therapy is very safe and saves many lives, the main dangers of transfusion include:   Getting an infectious disease.  Developing a transfusion reaction. This is an allergic reaction to something in the blood you were given. Every precaution is taken to prevent this. The decision to have a blood transfusion has been considered carefully by your caregiver before blood is given. Blood is not given unless the benefits outweigh the risks. AFTER THE TRANSFUSION  Right after receiving a blood transfusion, you will usually feel much better and  more energetic. This is especially true if your red blood cells have gotten low (anemic). The transfusion raises the level of the red blood cells which carry oxygen, and this usually causes an energy increase.  The nurse administering the transfusion will monitor you carefully for complications. HOME CARE INSTRUCTIONS  No special instructions are needed after a transfusion. You may find your energy is better. Speak with your caregiver about any limitations on activity for underlying diseases you may have. SEEK MEDICAL CARE IF:   Your condition is not improving after your transfusion.  You develop redness or irritation at the intravenous (IV) site. SEEK IMMEDIATE MEDICAL CARE IF:  Any of the following symptoms occur over the next 12 hours:  Shaking chills.  You have a temperature by mouth above 102 F (38.9 C), not controlled by medicine.  Chest, back, or muscle pain.  People around you feel you are not acting correctly or are confused.  Shortness of breath or difficulty breathing.  Dizziness and fainting.  You get a rash or develop hives.  You have a decrease in urine output.  Your urine turns a dark color or changes to pink, red, or brown. Any of the following symptoms occur over the next 10 days:  You have a temperature by mouth above 102 F (38.9 C), not controlled by medicine.  Shortness of breath.  Weakness after normal activity.  The white part of the eye turns yellow (jaundice).  You have a decrease in the amount of urine or are urinating less often.  Your urine turns a dark color or changes to pink, red, or brown. Document Released: 02/28/2000 Document Revised: 05/25/2011 Document Reviewed: 10/17/2007 [facility] Patient Information 2014 [place], [place].  _______________________________________________________________________

## 2014-02-28 NOTE — Pre-Procedure Instructions (Signed)
02-28-14 EKG/ CXR 7'15 Epic.

## 2014-02-28 NOTE — Progress Notes (Signed)
PTT done 02/28/14 faxed via EPIC to Dr Charlann Boxerlin.

## 2014-03-06 ENCOUNTER — Encounter (HOSPITAL_COMMUNITY): Payer: Self-pay | Admitting: *Deleted

## 2014-03-06 ENCOUNTER — Inpatient Hospital Stay (HOSPITAL_COMMUNITY)
Admission: RE | Admit: 2014-03-06 | Discharge: 2014-03-07 | DRG: 470 | Disposition: A | Discharge status: Home / Self Care | Payer: BC Managed Care – PPO | Source: Ambulatory Visit | Attending: Orthopedic Surgery | Admitting: Orthopedic Surgery

## 2014-03-06 ENCOUNTER — Inpatient Hospital Stay (HOSPITAL_COMMUNITY): Payer: BC Managed Care – PPO | Admitting: Anesthesiology

## 2014-03-06 ENCOUNTER — Inpatient Hospital Stay (HOSPITAL_COMMUNITY): Payer: BC Managed Care – PPO

## 2014-03-06 ENCOUNTER — Encounter (HOSPITAL_COMMUNITY)
Admission: RE | Disposition: A | Discharge status: Home / Self Care | Payer: Self-pay | Source: Ambulatory Visit | Attending: Orthopedic Surgery

## 2014-03-06 DIAGNOSIS — Z6838 Body mass index (BMI) 38.0-38.9, adult: Secondary | ICD-10-CM

## 2014-03-06 DIAGNOSIS — F17210 Nicotine dependence, cigarettes, uncomplicated: Secondary | ICD-10-CM | POA: Diagnosis present

## 2014-03-06 DIAGNOSIS — I10 Essential (primary) hypertension: Secondary | ICD-10-CM | POA: Diagnosis present

## 2014-03-06 DIAGNOSIS — E119 Type 2 diabetes mellitus without complications: Secondary | ICD-10-CM | POA: Diagnosis present

## 2014-03-06 DIAGNOSIS — Z881 Allergy status to other antibiotic agents status: Secondary | ICD-10-CM | POA: Diagnosis not present

## 2014-03-06 DIAGNOSIS — E669 Obesity, unspecified: Secondary | ICD-10-CM | POA: Diagnosis present

## 2014-03-06 DIAGNOSIS — M1612 Unilateral primary osteoarthritis, left hip: Secondary | ICD-10-CM | POA: Diagnosis present

## 2014-03-06 DIAGNOSIS — Z91010 Allergy to peanuts: Secondary | ICD-10-CM | POA: Diagnosis not present

## 2014-03-06 DIAGNOSIS — Z90710 Acquired absence of both cervix and uterus: Secondary | ICD-10-CM | POA: Diagnosis not present

## 2014-03-06 DIAGNOSIS — M87852 Other osteonecrosis, left femur: Secondary | ICD-10-CM | POA: Diagnosis present

## 2014-03-06 DIAGNOSIS — Z96649 Presence of unspecified artificial hip joint: Secondary | ICD-10-CM

## 2014-03-06 DIAGNOSIS — Z882 Allergy status to sulfonamides status: Secondary | ICD-10-CM | POA: Diagnosis not present

## 2014-03-06 DIAGNOSIS — M25552 Pain in left hip: Secondary | ICD-10-CM | POA: Diagnosis present

## 2014-03-06 HISTORY — PX: TOTAL HIP ARTHROPLASTY: SHX124

## 2014-03-06 LAB — GLUCOSE, CAPILLARY
Glucose-Capillary: 108 mg/dL — ABNORMAL HIGH (ref 70–99)
Glucose-Capillary: 97 mg/dL (ref 70–99)

## 2014-03-06 LAB — TYPE AND SCREEN
ABO/RH(D): A POS
Antibody Screen: NEGATIVE

## 2014-03-06 SURGERY — ARTHROPLASTY, HIP, TOTAL, ANTERIOR APPROACH
Anesthesia: Spinal | Laterality: Left

## 2014-03-06 MED ORDER — MAGNESIUM CITRATE PO SOLN: 1.0000 | Freq: Once | ORAL | Status: AC | PRN

## 2014-03-06 MED ORDER — PROMETHAZINE HCL 25 MG/ML IJ SOLN: 6.2500 mg | INTRAMUSCULAR | Status: DC | PRN

## 2014-03-06 MED ORDER — METOCLOPRAMIDE HCL 5 MG/ML IJ SOLN: 5.0000 mg | Freq: Three times a day (TID) | INTRAMUSCULAR | Status: DC | PRN

## 2014-03-06 MED ORDER — POLYETHYLENE GLYCOL 3350 17 G PO PACK
17.0000 g | PACK | Freq: Two times a day (BID) | ORAL | Status: DC
  Administered 2014-03-07: 17 g via ORAL

## 2014-03-06 MED ORDER — TRANEXAMIC ACID 100 MG/ML IV SOLN
1000.0000 mg | Freq: Once | INTRAVENOUS | Status: DC
  Filled 2014-03-06: qty 10

## 2014-03-06 MED ORDER — BUPIVACAINE HCL (PF) 0.5 % IJ SOLN
INTRAMUSCULAR | Status: DC | PRN
  Administered 2014-03-06: 3 mL

## 2014-03-06 MED ORDER — HYDROCHLOROTHIAZIDE 12.5 MG PO CAPS
12.5000 mg | ORAL_CAPSULE | Freq: Every morning | ORAL | Status: DC
  Administered 2014-03-07: 12.5 mg via ORAL
  Filled 2014-03-06 (×2): qty 1

## 2014-03-06 MED ORDER — PROPOFOL 10 MG/ML IV BOLUS
INTRAVENOUS | Status: AC
  Filled 2014-03-06: qty 20

## 2014-03-06 MED ORDER — PROPOFOL 10 MG/ML IV BOLUS
INTRAVENOUS | Status: DC | PRN
  Administered 2014-03-06: 20 mg via INTRAVENOUS
  Administered 2014-03-06: 40 mg via INTRAVENOUS
  Administered 2014-03-06 (×2): 20 mg via INTRAVENOUS

## 2014-03-06 MED ORDER — MENTHOL 3 MG MT LOZG
1.0000 | LOZENGE | OROMUCOSAL | Status: DC | PRN
  Filled 2014-03-06: qty 9

## 2014-03-06 MED ORDER — SODIUM CHLORIDE 0.9 % IV SOLN: 100.0000 mL/h | INTRAVENOUS | Status: DC

## 2014-03-06 MED ORDER — BUPIVACAINE HCL (PF) 0.5 % IJ SOLN
INTRAMUSCULAR | Status: AC
  Filled 2014-03-06: qty 30

## 2014-03-06 MED ORDER — CHLORHEXIDINE GLUCONATE 4 % EX LIQD: 60.0000 mL | Freq: Once | CUTANEOUS | Status: DC

## 2014-03-06 MED ORDER — ONDANSETRON HCL 4 MG/2ML IJ SOLN
4.0000 mg | Freq: Four times a day (QID) | INTRAMUSCULAR | Status: DC | PRN
  Administered 2014-03-06: 4 mg via INTRAVENOUS
  Filled 2014-03-06: qty 2

## 2014-03-06 MED ORDER — DEXTROSE 5 % IV SOLN
500.0000 mg | Freq: Four times a day (QID) | INTRAVENOUS | Status: DC | PRN
  Administered 2014-03-06: 500 mg via INTRAVENOUS
  Filled 2014-03-06 (×2): qty 5

## 2014-03-06 MED ORDER — PROPOFOL INFUSION 10 MG/ML OPTIME
INTRAVENOUS | Status: DC | PRN
  Administered 2014-03-06: 100 ug/kg/min via INTRAVENOUS

## 2014-03-06 MED ORDER — GLYBURIDE-METFORMIN 2.5-500 MG PO TABS: 1.0000 | ORAL_TABLET | Freq: Two times a day (BID) | ORAL | Status: DC

## 2014-03-06 MED ORDER — MIDAZOLAM HCL 2 MG/2ML IJ SOLN
INTRAMUSCULAR | Status: AC
  Filled 2014-03-06: qty 2

## 2014-03-06 MED ORDER — HYDROCHLOROTHIAZIDE 12.5 MG PO CAPS
12.5000 mg | ORAL_CAPSULE | Freq: Every day | ORAL | Status: DC
  Filled 2014-03-06 (×2): qty 1

## 2014-03-06 MED ORDER — FERROUS SULFATE 325 (65 FE) MG PO TABS
325.0000 mg | ORAL_TABLET | Freq: Three times a day (TID) | ORAL | Status: DC
  Filled 2014-03-06 (×4): qty 1

## 2014-03-06 MED ORDER — ALUM & MAG HYDROXIDE-SIMETH 200-200-20 MG/5ML PO SUSP: 30.0000 mL | ORAL | Status: DC | PRN

## 2014-03-06 MED ORDER — PHENOL 1.4 % MT LIQD
1.0000 | OROMUCOSAL | Status: DC | PRN
  Filled 2014-03-06: qty 177

## 2014-03-06 MED ORDER — SODIUM CHLORIDE 0.9 % IV SOLN
INTRAVENOUS | Status: DC
  Administered 2014-03-06: 16:00:00 via INTRAVENOUS

## 2014-03-06 MED ORDER — LACTATED RINGERS IV SOLN: INTRAVENOUS | Status: DC

## 2014-03-06 MED ORDER — ONDANSETRON HCL 4 MG/2ML IJ SOLN
INTRAMUSCULAR | Status: AC
  Filled 2014-03-06: qty 2

## 2014-03-06 MED ORDER — PHENYLEPHRINE HCL 10 MG/ML IJ SOLN
10.0000 mg | INTRAVENOUS | Status: DC | PRN
  Administered 2014-03-06: 40 ug/min via INTRAVENOUS

## 2014-03-06 MED ORDER — CEFAZOLIN SODIUM-DEXTROSE 2-3 GM-% IV SOLR
INTRAVENOUS | Status: AC
  Filled 2014-03-06: qty 50

## 2014-03-06 MED ORDER — MEPERIDINE HCL 50 MG/ML IJ SOLN: 6.2500 mg | INTRAMUSCULAR | Status: DC | PRN

## 2014-03-06 MED ORDER — INSULIN ASPART 100 UNIT/ML ~~LOC~~ SOLN
0.0000 [IU] | Freq: Three times a day (TID) | SUBCUTANEOUS | Status: DC
  Administered 2014-03-06: 8 [IU] via SUBCUTANEOUS

## 2014-03-06 MED ORDER — LACTATED RINGERS IV SOLN
INTRAVENOUS | Status: DC | PRN
  Administered 2014-03-06 (×2): via INTRAVENOUS

## 2014-03-06 MED ORDER — CEFAZOLIN SODIUM-DEXTROSE 2-3 GM-% IV SOLR
2.0000 g | INTRAVENOUS | Status: AC
  Administered 2014-03-06: 2 g via INTRAVENOUS

## 2014-03-06 MED ORDER — LOSARTAN POTASSIUM 50 MG PO TABS
50.0000 mg | ORAL_TABLET | Freq: Every day | ORAL | Status: DC
  Administered 2014-03-07: 50 mg via ORAL
  Filled 2014-03-06 (×2): qty 1

## 2014-03-06 MED ORDER — ONDANSETRON HCL 4 MG PO TABS: 4.0000 mg | ORAL_TABLET | Freq: Four times a day (QID) | ORAL | Status: DC | PRN

## 2014-03-06 MED ORDER — HYDROMORPHONE HCL 1 MG/ML IJ SOLN
INTRAMUSCULAR | Status: AC
  Filled 2014-03-06: qty 1

## 2014-03-06 MED ORDER — DIPHENHYDRAMINE HCL 25 MG PO CAPS: 25.0000 mg | ORAL_CAPSULE | Freq: Four times a day (QID) | ORAL | Status: DC | PRN

## 2014-03-06 MED ORDER — METOCLOPRAMIDE HCL 10 MG PO TABS: 5.0000 mg | ORAL_TABLET | Freq: Three times a day (TID) | ORAL | Status: DC | PRN

## 2014-03-06 MED ORDER — CEFAZOLIN SODIUM-DEXTROSE 2-3 GM-% IV SOLR
2.0000 g | Freq: Four times a day (QID) | INTRAVENOUS | Status: AC
  Administered 2014-03-06: 2 g via INTRAVENOUS
  Filled 2014-03-06 (×2): qty 50

## 2014-03-06 MED ORDER — HYDROCODONE-ACETAMINOPHEN 7.5-325 MG PO TABS
1.0000 | ORAL_TABLET | ORAL | Status: DC
  Administered 2014-03-06 – 2014-03-07 (×6): 2 via ORAL
  Filled 2014-03-06 (×7): qty 2

## 2014-03-06 MED ORDER — HYDROMORPHONE HCL 1 MG/ML IJ SOLN
0.2500 mg | INTRAMUSCULAR | Status: DC | PRN
Start: 2014-03-06 — End: 2014-03-06
  Administered 2014-03-06 (×4): 0.5 mg via INTRAVENOUS

## 2014-03-06 MED ORDER — DEXAMETHASONE SODIUM PHOSPHATE 10 MG/ML IJ SOLN: 10.0000 mg | Freq: Once | INTRAMUSCULAR | Status: DC

## 2014-03-06 MED ORDER — ASPIRIN EC 325 MG PO TBEC
325.0000 mg | DELAYED_RELEASE_TABLET | Freq: Two times a day (BID) | ORAL | Status: DC
  Administered 2014-03-07: 325 mg via ORAL
  Filled 2014-03-06 (×4): qty 1

## 2014-03-06 MED ORDER — FENTANYL CITRATE 0.05 MG/ML IJ SOLN
INTRAMUSCULAR | Status: AC
  Filled 2014-03-06: qty 2

## 2014-03-06 MED ORDER — METFORMIN HCL 500 MG PO TABS
500.0000 mg | ORAL_TABLET | Freq: Two times a day (BID) | ORAL | Status: DC
  Administered 2014-03-06 – 2014-03-07 (×2): 500 mg via ORAL
  Filled 2014-03-06 (×4): qty 1

## 2014-03-06 MED ORDER — MIDAZOLAM HCL 5 MG/5ML IJ SOLN
INTRAMUSCULAR | Status: DC | PRN
  Administered 2014-03-06: 2 mg via INTRAVENOUS

## 2014-03-06 MED ORDER — DOCUSATE SODIUM 100 MG PO CAPS
100.0000 mg | ORAL_CAPSULE | Freq: Two times a day (BID) | ORAL | Status: DC
  Administered 2014-03-07 (×2): 100 mg via ORAL

## 2014-03-06 MED ORDER — DEXAMETHASONE SODIUM PHOSPHATE 10 MG/ML IJ SOLN
10.0000 mg | Freq: Once | INTRAMUSCULAR | Status: AC
  Administered 2014-03-06: 10 mg via INTRAVENOUS

## 2014-03-06 MED ORDER — FENTANYL CITRATE 0.05 MG/ML IJ SOLN
INTRAMUSCULAR | Status: DC | PRN
  Administered 2014-03-06 (×2): 50 ug via INTRAVENOUS

## 2014-03-06 MED ORDER — METHOCARBAMOL 500 MG PO TABS
500.0000 mg | ORAL_TABLET | Freq: Four times a day (QID) | ORAL | Status: DC | PRN
  Administered 2014-03-07: 500 mg via ORAL
  Filled 2014-03-06 (×2): qty 1

## 2014-03-06 MED ORDER — BISACODYL 10 MG RE SUPP: 10.0000 mg | Freq: Every day | RECTAL | Status: DC | PRN

## 2014-03-06 MED ORDER — DEXAMETHASONE SODIUM PHOSPHATE 10 MG/ML IJ SOLN
INTRAMUSCULAR | Status: AC
  Filled 2014-03-06: qty 1

## 2014-03-06 MED ORDER — GLYBURIDE 2.5 MG PO TABS
2.5000 mg | ORAL_TABLET | Freq: Two times a day (BID) | ORAL | Status: DC
  Administered 2014-03-06 – 2014-03-07 (×2): 2.5 mg via ORAL
  Filled 2014-03-06 (×4): qty 1

## 2014-03-06 MED ORDER — HYDROMORPHONE HCL 1 MG/ML IJ SOLN
0.5000 mg | INTRAMUSCULAR | Status: DC | PRN
  Administered 2014-03-06: 1 mg via INTRAVENOUS
  Filled 2014-03-06: qty 1

## 2014-03-06 MED ORDER — LOSARTAN POTASSIUM-HCTZ 50-12.5 MG PO TABS: 1.0000 | ORAL_TABLET | Freq: Every morning | ORAL | Status: DC

## 2014-03-06 MED ORDER — SODIUM CHLORIDE 0.9 % IR SOLN
Status: DC | PRN
  Administered 2014-03-06: 1000 mL

## 2014-03-06 SURGICAL SUPPLY — 39 items
ADH SKN CLS APL DERMABOND .7 (GAUZE/BANDAGES/DRESSINGS) ×1
BAG SPEC THK2 15X12 ZIP CLS (MISCELLANEOUS)
BAG ZIPLOCK 12X15 (MISCELLANEOUS) IMPLANT
CAPT HIP TOTAL 2 ×3 IMPLANT
COVER PERINEAL POST (MISCELLANEOUS) ×3 IMPLANT
DERMABOND ADVANCED (GAUZE/BANDAGES/DRESSINGS) ×2
DERMABOND ADVANCED .7 DNX12 (GAUZE/BANDAGES/DRESSINGS) ×1 IMPLANT
DRAPE C-ARM 42X120 X-RAY (DRAPES) ×3 IMPLANT
DRAPE STERI IOBAN 125X83 (DRAPES) ×3 IMPLANT
DRAPE U-SHAPE 47X51 STRL (DRAPES) ×9 IMPLANT
DRSG AQUACEL AG ADV 3.5X10 (GAUZE/BANDAGES/DRESSINGS) ×3 IMPLANT
DURAPREP 26ML APPLICATOR (WOUND CARE) ×3 IMPLANT
ELECT BLADE TIP CTD 4 INCH (ELECTRODE) ×3 IMPLANT
ELECT PENCIL ROCKER SW 15FT (MISCELLANEOUS) IMPLANT
ELECT REM PT RETURN 15FT ADLT (MISCELLANEOUS) IMPLANT
ELECT REM PT RETURN 9FT ADLT (ELECTROSURGICAL) ×3
ELECTRODE REM PT RTRN 9FT ADLT (ELECTROSURGICAL) ×1 IMPLANT
FACESHIELD WRAPAROUND (MASK) ×12 IMPLANT
GLOVE BIOGEL PI IND STRL 7.5 (GLOVE) ×1 IMPLANT
GLOVE BIOGEL PI IND STRL 8.5 (GLOVE) ×1 IMPLANT
GLOVE BIOGEL PI INDICATOR 7.5 (GLOVE) ×2
GLOVE BIOGEL PI INDICATOR 8.5 (GLOVE) ×2
GLOVE ORTHO TXT STRL SZ7.5 (GLOVE) ×3 IMPLANT
GOWN SPEC L3 XXLG W/TWL (GOWN DISPOSABLE) ×3 IMPLANT
GOWN STRL REUS W/TWL LRG LVL3 (GOWN DISPOSABLE) ×3 IMPLANT
HOLDER FOLEY CATH W/STRAP (MISCELLANEOUS) ×3 IMPLANT
KIT BASIN OR (CUSTOM PROCEDURE TRAY) ×3 IMPLANT
LIQUID BAND (GAUZE/BANDAGES/DRESSINGS) ×3 IMPLANT
PACK TOTAL JOINT (CUSTOM PROCEDURE TRAY) ×3 IMPLANT
SAW OSC TIP CART 19.5X105X1.3 (SAW) ×3 IMPLANT
SUT MNCRL AB 4-0 PS2 18 (SUTURE) ×3 IMPLANT
SUT VIC AB 1 CT1 36 (SUTURE) ×9 IMPLANT
SUT VIC AB 2-0 CT1 27 (SUTURE) ×6
SUT VIC AB 2-0 CT1 TAPERPNT 27 (SUTURE) ×2 IMPLANT
SUT VLOC 180 0 24IN GS25 (SUTURE) ×3 IMPLANT
TOWEL OR 17X26 10 PK STRL BLUE (TOWEL DISPOSABLE) ×3 IMPLANT
TOWEL OR NON WOVEN STRL DISP B (DISPOSABLE) IMPLANT
TRAY FOLEY METER SIL LF 16FR (CATHETERS) ×3 IMPLANT
WATER STERILE IRR 1500ML POUR (IV SOLUTION) ×3 IMPLANT

## 2014-03-06 NOTE — Anesthesia Postprocedure Evaluation (Signed)
Anesthesia Post Note  Patient: Lisa [name]  Procedure(s) Performed: Procedure(s) (LRB): LEFT TOTAL HIP ARTHROPLASTY ANTERIOR APPROACH (Left)  Anesthesia type: Spinal  Patient location: PACU  Post pain: Pain level controlled  Post assessment: Post-op Vital signs reviewed  Last Vitals: BP 100/83 mmHg  Pulse 80  Temp(Src) 36.8 C (Oral)  Resp 16  Ht 5\' 6"  (1.676 m)  Wt 240 lb (108.863 kg)  BMI 38.76 kg/m2  SpO2 100%  Post vital signs: Reviewed  Level of consciousness: sedated  Complications: No apparent anesthesia complications

## 2014-03-06 NOTE — Anesthesia Preprocedure Evaluation (Signed)
Anesthesia Evaluation  Patient identified by MRN, date of birth, ID band Patient awake    Reviewed: Allergy & Precautions, H&P , NPO status , Patient's Chart, lab work & pertinent test results  Airway Mallampati: II TM Distance: >3 FB Neck ROM: Full    Dental no notable dental hx.    Pulmonary Current Smoker,  breath sounds clear to auscultation  Pulmonary exam normal       Cardiovascular hypertension, Pt. on medications Rhythm:Regular Rate:Normal     Neuro/Psych negative neurological ROS  negative psych ROS   GI/Hepatic negative GI ROS, Neg liver ROS,   Endo/Other  diabetes, Type 2Morbid obesity  Renal/GU negative Renal ROS     Musculoskeletal negative musculoskeletal ROS (+)   Abdominal   Peds  Hematology negative hematology ROS (+)   Anesthesia Other Findings   Reproductive/Obstetrics negative OB ROS                          Anesthesia Physical Anesthesia Plan  ASA: III  Anesthesia Plan: Spinal   Post-op Pain Management:    Induction:   Airway Management Planned:   Additional Equipment:   Intra-op Plan:   Post-operative Plan:   Informed Consent: I have reviewed the patients History and Physical, chart, labs and discussed the procedure including the risks, benefits and alternatives for the proposed anesthesia with the patient or authorized representative who has indicated his/her understanding and acceptance.   Dental advisory given  Plan Discussed with: CRNA  Anesthesia Plan Comments:        Anesthesia Quick Evaluation  

## 2014-03-06 NOTE — Evaluation (Signed)
Physical Therapy Evaluation Patient Details Name: Lisa [name] MRN: [id number] DOB: 06/10/1970 Today's Date: 03/06/2014   History of Present Illness  L THA -direct anterior  Clinical Impression  **Pt is s/p THA resulting in the deficits listed below (see PT Problem List). ** Pt will benefit from skilled PT to increase their independence and safety with mobility to allow discharge to the venue listed below.   Pt ambulated 25' with min A. Min A for L hip exercises. Good progress expected.  *    Follow Up Recommendations Home health PT    Equipment Recommendations  3in1 (PT)    Recommendations for Other Services OT consult     Precautions / Restrictions Precautions Precautions: Fall Restrictions Weight Bearing Restrictions: No      Mobility  Bed Mobility Overal bed mobility: Needs Assistance Bed Mobility: Supine to Sit     Supine to sit: Mod assist     General bed mobility comments: assist to raise trunk and advance BLEs  Transfers Overall transfer level: Needs assistance Equipment used: Rolling walker (2 wheeled) Transfers: Sit to/from Stand Sit to Stand: Mod assist         General transfer comment: assist to rise, cues hand placement  Ambulation/Gait Ambulation/Gait assistance: Min assist Ambulation Distance (Feet): 25 Feet Assistive device: Rolling walker (2 wheeled) Gait Pattern/deviations: Step-to pattern;Antalgic   Gait velocity interpretation: Below normal speed for age/gender General Gait Details: cues for sequencing  Stairs            Wheelchair Mobility    Modified Rankin (Stroke Patients Only)       Balance                                             Pertinent Vitals/Pain Pain Assessment: 0-10 Pain Score: 8  Pain Location: L hip Pain Descriptors / Indicators: Sore Pain Intervention(s): Monitored during session;Premedicated before session;Limited activity within patient's tolerance;Ice applied    Home  Living Family/patient expects to be discharged to:: Private residence Living Arrangements: Spouse/significant other Available Help at Discharge: Family Type of Home: Mobile home Home Access: Stairs to enter   Entrance Stairs-Number of Steps: 2   Home Equipment: Walker - 2 wheels;Cane - single point      Prior Function Level of Independence: Independent with assistive device(s)               Hand Dominance        Extremity/Trunk Assessment   Upper Extremity Assessment: Overall WFL for tasks assessed           Lower Extremity Assessment: LLE deficits/detail   LLE Deficits / Details: 3/5 L knee extension, ankle WNL, hip AAROM WFL, hip strength +2/5     Communication   Communication: No difficulties  Cognition Arousal/Alertness: Awake/alert Behavior During Therapy: WFL for tasks assessed/performed Overall Cognitive Status: Within Functional Limits for tasks assessed                      General Comments      Exercises Total Joint Exercises Ankle Circles/Pumps: AROM;Both;10 reps Heel Slides: AAROM;Left;10 reps Hip ABduction/ADduction: AAROM;Left;10 reps      Assessment/Plan    PT Assessment Patient needs continued PT services  PT Diagnosis Difficulty walking;Acute pain   PT Problem List Decreased strength;Decreased activity tolerance;Pain;Decreased knowledge of use of DME;Decreased mobility  PT Treatment Interventions DME  instruction;Gait training;Stair training;Functional mobility training;Therapeutic activities;Patient/family education;Therapeutic exercise   PT Goals (Current goals can be found in the Care Plan section) Acute Rehab PT Goals 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 PT Goal Formulation: With patient/family Time For Goal Achievement: 03/20/14 Potential to Achieve Goals: Good    Frequency 7X/week   Barriers to discharge        Co-evaluation               End of Session Equipment Utilized During Treatment:  Gait belt Activity Tolerance: Patient limited by pain Patient left: in chair;with call bell/phone within reach Nurse Communication: Mobility status         Time: [id number] PT Time Calculation (min) (ACUTE ONLY): 17 min   Charges:   PT Evaluation $Initial PT Evaluation Tier I: 1 Procedure PT Treatments $Gait Training: 8-22 mins   PT G Codes:          Tamala SerUhlenberg, Maleko Greulich Kistler 03/06/2014, 3:18 PM [phone]

## 2014-03-06 NOTE — Interval H&P Note (Signed)
History and Physical Interval Note:  03/06/2014 6:37 AM  Lisa [name]  has presented today for surgery, with the diagnosis of LEFT HIP AVN WITH PROLAPSE STATUS POST HIP CORE DECOMPRESSION  The various methods of treatment have been discussed with the patient and family. After consideration of risks, benefits and other options for treatment, the patient has consented to  Procedure(s): LEFT TOTAL HIP ARTHROPLASTY ANTERIOR APPROACH (Left) as a surgical intervention .  The patient's history has been reviewed, patient examined, no change in status, stable for surgery.  I have reviewed the patient's chart and labs.  Questions were answered to the patient's satisfaction.     Shelda PalLIN,Haden Cavenaugh D

## 2014-03-06 NOTE — Plan of Care (Signed)
Problem: Consults Goal: Diagnosis- Total Joint Replacement Left anterior hip     

## 2014-03-06 NOTE — Transfer of Care (Signed)
Immediate Anesthesia Transfer of Care Note  Patient: Lisa [name]  Procedure(s) Performed: Procedure(s) (LRB): LEFT TOTAL HIP ARTHROPLASTY ANTERIOR APPROACH (Left)  Patient Location: PACU  Anesthesia Type: Spinal  Level of Consciousness: sedated, patient cooperative and responds to stimulation  Airway & Oxygen Therapy: Patient Spontanous Breathing and Patient connected to face mask oxgen  Post-op Assessment: Report given to PACU RN and Post -op Vital signs reviewed and stable  Post vital signs: Reviewed and stable  Complications: No apparent anesthesia complications

## 2014-03-06 NOTE — Anesthesia Procedure Notes (Signed)
Spinal Patient location during procedure: OR Start time: 03/06/2014 8:19 AM End time: 03/06/2014 8:24 AM Staffing Resident/CRNA: Anne Fu Performed by: resident/CRNA  Preanesthetic Checklist Completed: patient identified, site marked, surgical consent, pre-op evaluation, timeout performed, IV checked, risks and benefits discussed and monitors and equipment checked Spinal Block Patient position: sitting Prep: Betadine Patient monitoring: heart rate, continuous pulse ox and blood pressure Approach: right paramedian Location: L2-3 Injection technique: single-shot Needle Needle type: [place]  Needle gauge: 25 G Needle length: 9 cm Assessment Sensory level: T6 Additional Notes Expiration date of kit checked and confirmed. Patient tolerated procedure well, without complication.  X 1 attempt with noted clear CSF return loss of motor and sensory on exam post injection.

## 2014-03-06 NOTE — Progress Notes (Signed)
Utilization review completed.  

## 2014-03-06 NOTE — Progress Notes (Signed)
[facility]   [facility] the [facility]: Commode (patient declined rw - has one at home)  If patient discharges after hours, please call [phone].   Renard HamperLecretia Williamson 03/06/2014, 4:43 PM

## 2014-03-06 NOTE — Progress Notes (Signed)
PT Cancellation Note  Patient Details Name: Lisa [name] MRN: [id number] DOB: 03/17/1970   Cancelled Treatment:    Reason Eval/Treat Not Completed: Pain limiting ability to participate. Pain is 9/10. Will attempt PT later.    Ralene BatheUhlenberg, Eugen Jeansonne Kistler 03/06/2014, 2:04 PM  [phone]

## 2014-03-06 NOTE — Op Note (Signed)
NAME:  Lisa [name]                ACCOUNT NO.: [id number]      MEDICAL RECORD NO.: [id number]      [facility]      PHYSICIAN:  Durene RomansLIN,Deeric Cruise D  DATE OF BIRTH:  05/21/1970     DATE OF PROCEDURE:  03/06/2014                                 OPERATIVE REPORT         PREOPERATIVE DIAGNOSIS: Left  hip avascular necrosis with collapse.      POSTOPERATIVE DIAGNOSIS:  Left hip avascular necrosis with collpase     PROCEDURE:  Left total hip replacement through an anterior approach   utilizing DePuy THR system, component size 52mm pinnacle cup, a size 36+4 neutral   Altrex liner, a size 4 Hi Tri Lock stem with a 36+5 delta ceramic   ball.      SURGEON:  Madlyn FrankelMatthew D. Charlann Boxerlin, M.D.      ASSISTANT:  Skip MayerBlair Roberts, PA-C      ANESTHESIA:  Spinal.      SPECIMENS:  None.      COMPLICATIONS:  None.      BLOOD LOSS:  800 cc     DRAINS:  One Hemovac.      INDICATION OF THE PROCEDURE:  Lisa [name] is a 43 y.o. female who had   presented to office for evaluation of left hip pain.  Radiographs revealed   progressive degenerative changes associated with unfortunate progression of avascular necrosis with femoral head collapse despite an attempt at core decompression.  The patient had painful limited range of   motion significantly affecting their overall quality of life.  The patient was failing to    respond to conservative measures, and at this point was ready   to proceed with more definitive measures.  The patient has noted progressive   degenerative changes in his hip, progressive problems and dysfunction   with regarding the hip prior to surgery.  Consent was obtained for   benefit of pain relief.  Specific risk of infection, DVT, component   failure, dislocation, need for revision surgery, as well discussion of   the anterior versus posterior approach were reviewed.  Consent was   obtained for benefit of anterior pain relief through an anterior   approach.      PROCEDURE IN DETAIL:  The patient was brought to operative theater.   Once adequate anesthesia, preoperative antibiotics, 2gm of Ancef administered.   The patient was positioned supine on the OSI Hanna table.  Once adequate   padding of boney process was carried out, we had predraped out the hip, and  used fluoroscopy to confirm orientation of the pelvis and position.      The left hip was then prepped and draped from proximal iliac crest to   mid thigh with shower curtain technique.      Time-out was performed identifying the patient, planned procedure, and   extremity.     An incision was then made 2 cm distal and lateral to the   anterior superior iliac spine extending over the orientation of the   tensor fascia lata muscle and sharp dissection was carried down to the   fascia of the muscle and protractor placed in the soft tissues.  The fascia was then incised.  The muscle belly was identified and swept   laterally and retractor placed along the superior neck.  Following   cauterization of the circumflex vessels and removing some pericapsular   fat, a second cobra retractor was placed on the inferior neck.  A third   retractor was placed on the anterior acetabulum after elevating the   anterior rectus.  A L-capsulotomy was along the line of the   superior neck to the trochanteric fossa, then extended proximally and   distally.  Tag sutures were placed and the retractors were then placed   intracapsular.  We then identified the trochanteric fossa and   orientation of my neck cut, confirmed this radiographically   and then made a neck osteotomy with the femur on traction.  The femoral   head was removed without difficulty or complication.  Traction was let   off and retractors were placed posterior and anterior around the   acetabulum.      The labrum and foveal tissue were debrided.  I began reaming with a 47mm   reamer and reamed up to 51mm reamer with good  bony bed preparation and a 52mm   cup was chosen.  The final 52mm Pinnacle cup was then impacted under fluoroscopy  to confirm the depth of penetration and orientation with respect to   abduction.  A screw was placed followed by the hole eliminator.  The final   36+4 neutrak Altrex liner was impacted with good visualized rim fit.  The cup was positioned anatomically within the acetabular portion of the pelvis.      At this point, the femur was rolled at 80 degrees.  Further capsule was   released off the inferior aspect of the femoral neck.  I then   released the superior capsule proximally.  The hook was placed laterally   along the femur and elevated manually and held in position with the bed   hook.  The leg was then extended and adducted with the leg rolled to 100   degrees of external rotation.  Once the proximal femur was fully   exposed, I used a box osteotome to set orientation.  I then began   broaching with the starting chili pepper broach and passed this by hand and then broached up to 4.  With the 4 broach in place I chose a high offset neck and did a trial reduction.  The offset was appropriate, leg lengths   appeared to be equal, confirmed radiographically.   Given these findings, I went ahead and dislocated the hip, repositioned all   retractors and positioned the right hip in the extended and abducted position.  The final 4 Hi Tri Lock stem was   chosen and it was impacted down to the level of neck cut.  Based on this   and the trial reduction, a 36+5 delta ceramic ball was chosen based on trial reductions and   impacted onto a clean and dry trunnion, and the hip was reduced.  The   hip had been irrigated throughout the case again at this point.  I did   reapproximate the superior capsular leaflet to the anterior leaflet   using #1 Vicryl.  The fascia of the   tensor fascia lata muscle was then reapproximated using #1 Vicryl and #0 V-lock sutures.  The   remaining wound was  closed with 2-0 Vicryl and running 4-0 Monocryl.   The hip was cleaned, dried, and  dressed sterilely using Dermabond and   Aquacel dressing.  She was then brought   to recovery room in stable condition tolerating the procedure well.    Skip MayerBlair Roberts, PA-C was present for the entirety of the case involved from   preoperative positioning, perioperative retractor management, general   facilitation of the case, as well as primary wound closure as assistant.            Madlyn FrankelMatthew D. Charlann Boxerlin, M.D.        03/06/2014 9:59 AM

## 2014-03-07 DIAGNOSIS — E669 Obesity, unspecified: Secondary | ICD-10-CM | POA: Diagnosis present

## 2014-03-07 LAB — GLUCOSE, CAPILLARY
GLUCOSE-CAPILLARY: 280 mg/dL — AB (ref 70–99)
Glucose-Capillary: 123 mg/dL — ABNORMAL HIGH (ref 70–99)
Glucose-Capillary: 102 mg/dL — ABNORMAL HIGH (ref 70–99)

## 2014-03-07 LAB — BASIC METABOLIC PANEL
Anion gap: 4 — ABNORMAL LOW (ref 5–15)
BUN: 14 mg/dL (ref 6–23)
CO2: 28 mmol/L (ref 19–32)
CREATININE: 0.74 mg/dL (ref 0.50–1.10)
Calcium: 9.1 mg/dL (ref 8.4–10.5)
Chloride: 107 mEq/L (ref 96–112)
GFR calc Af Amer: >90 mL/min (ref >90)
Glucose, Bld: 141 mg/dL — ABNORMAL HIGH (ref 70–99)
Potassium: 4.1 mmol/L (ref 3.5–5.1)
Sodium: 139 mmol/L (ref 135–145)

## 2014-03-07 LAB — CBC
HCT: 29.9 % — ABNORMAL LOW (ref 36.0–46.0)
Hemoglobin: 9.8 g/dL — ABNORMAL LOW (ref 12.0–15.0)
MCH: 29.3 pg (ref 26.0–34.0)
MCHC: 32.8 g/dL (ref 30.0–36.0)
MCV: 89.5 fL (ref 78.0–100.0)
Platelets: 186 10*3/uL (ref 150–400)
RBC: 3.34 MIL/uL — ABNORMAL LOW (ref 3.87–5.11)
RDW: 13.5 % (ref 11.5–15.5)
WBC: 9.9 10*3/uL (ref 4.0–10.5)

## 2014-03-07 MED ORDER — HYDROCODONE-ACETAMINOPHEN 7.5-325 MG PO TABS: 1.0000 | ORAL_TABLET | ORAL | Status: DC | PRN

## 2014-03-07 MED ORDER — ASPIRIN 325 MG PO TBEC: 325.0000 mg | DELAYED_RELEASE_TABLET | Freq: Two times a day (BID) | ORAL | Status: AC

## 2014-03-07 NOTE — Progress Notes (Signed)
Discharge summary sent to payer through [facility]  

## 2014-03-07 NOTE — Progress Notes (Signed)
     Subjective: 1 Day Post-Op Procedure(s) (LRB): LEFT TOTAL HIP ARTHROPLASTY ANTERIOR APPROACH (Left)   Patient reports pain as mild, pain controlled. No events throughout the night. Ready to be discharged home if they do well with PT and pain stays controlled.   Objective:   VITALS:   Filed Vitals:   03/07/14 0610  BP: 104/63  Pulse: 70  Temp: 98.4 F (36.9 C)  Resp: 16    Dorsiflexion/Plantar flexion intact Incision: dressing C/D/I No cellulitis present Compartment soft  LABS  Recent Labs  03/07/14 0500  HGB 9.8*  HCT 29.9*  WBC 9.9  PLT 186     Recent Labs  03/07/14 0500  NA 139  K 4.1  BUN 14  CREATININE 0.74  GLUCOSE 141*     Assessment/Plan: 1 Day Post-Op Procedure(s) (LRB): LEFT TOTAL HIP ARTHROPLASTY ANTERIOR APPROACH (Left) Foley cath d/c'ed Advance diet Up with therapy D/C IV fluids Discharge home with home health  [facility]. Follow up with OLIN,Shadae Reino D in 2 weeks.  Contact information:  [facility] [address], [address] [place] [id number] [phone]    Obese (BMI 30-39.9) Estimated body mass index is 38.76 kg/(m^2) as calculated from the following:   Height as of this encounter: 5\' 6"  (1.676 m).   Weight as of this encounter: 108.863 kg (240 lb). Patient also counseled that weight may inhibit the healing process Patient counseled that losing weight will help with future health issues        Anastasio AuerbachMatthew S. Turkessa Ostrom   PAC  03/07/2014, 10:25 AM

## 2014-03-07 NOTE — Progress Notes (Signed)
Physical Therapy Treatment Patient Details Name: Lisa [name] MRN: [id number] DOB: 01/15/1971 Today's Date: 03/07/2014    History of Present Illness L THA -direct anterior    PT Comments    POD # 1 am session.  Amb in hallway and performed all supine THR TE's.  Pt given handout HEP and instructed on freq and use of ICE.  Pt doing well and plans to D/C today.  Follow Up Recommendations  Home health PT     Equipment Recommendations       Recommendations for Other Services       Precautions / Restrictions Precautions Precautions: Fall Restrictions Weight Bearing Restrictions: No    Mobility  Bed Mobility Overal bed mobility: Needs Assistance             General bed mobility comments: Pt OOB in recliner  Transfers Overall transfer level: Needs assistance Equipment used: Rolling walker (2 wheeled) Transfers: Sit to/from Stand Sit to Stand: Supervision Stand pivot transfers: Supervision       General transfer comment: assist to rise, cues hand placement  Ambulation/Gait Ambulation/Gait assistance: Supervision;Min guard Ambulation Distance (Feet): 57 Feet Assistive device: Rolling walker (2 wheeled) Gait Pattern/deviations: Step-to pattern Gait velocity: decreased   General Gait Details: cues for sequencing and safety with turns   Stairs            Wheelchair Mobility    Modified Rankin (Stroke Patients Only)       Balance Overall balance assessment: No apparent balance deficits (not formally assessed)                                  Cognition Arousal/Alertness: Awake/alert Behavior During Therapy: WFL for tasks assessed/performed Overall Cognitive Status: Within Functional Limits for tasks assessed                      Exercises   Total Hip Replacement TE's 10 reps ankle pumps 10 reps knee presses 10 reps heel slides 10 reps SAQ's 10 reps ABD Followed by ICE     General Comments        Pertinent  Vitals/Pain Pain Assessment: 0-10 Pain Score: 6  Pain Location: L hip Pain Descriptors / Indicators: Aching;Constant;Sore;Tightness Pain Intervention(s): Monitored during session;Premedicated before session;Repositioned;Ice applied    Home Living Family/patient expects to be discharged to:: Private residence Living Arrangements: Spouse/significant other Available Help at Discharge: Family Type of Home: Mobile home Home Access: Stairs to enter     Home Equipment: Environmental consultantWalker - 2 wheels;Cane - single point;Bedside commode;Tub bench Additional Comments: Pt was initially vague about her tub seat, kept reapeating "my husband helps me", when asked further, she stated she had a bench with 2 legs in and 2 legs out of the tub and she can sit on the edge and pivot self in    Prior Function Level of Independence: Independent with assistive device(s)          PT Goals (current goals can now be found in the care plan section) Acute Rehab PT Goals Patient Stated Goal: to return to work  Progress towards PT goals: Progressing toward goals    Frequency  7X/week    PT Plan      Co-evaluation             End of Session Equipment Utilized During Treatment: Gait belt Activity Tolerance: Patient tolerated treatment well Patient left: in chair;with call bell/phone  within reach     Time: 0839-0906 PT Time Calculation (min) (ACUTE ONLY): 27 min  Charges:  $Gait Training: 8-22 mins $Therapeutic Exercise: 8-22 mins                    G Codes:      Felecia ShellingLori Kayliee Atienza  PTA [facility]  Acute  Rehab Pager      [phone]

## 2014-03-07 NOTE — Plan of Care (Signed)
Problem: Consults Goal: Diagnosis- Total Joint Replacement Outcome: Completed/Met Date Met:  03/07/14 Primary Total Hip LEFT, Anterior

## 2014-03-07 NOTE — Evaluation (Signed)
Occupational Therapy Evaluation Patient Details Name: Lisa [name] MRN: [id number] DOB: 04/09/1970 Today's Date: 03/07/2014    History of Present Illness L THA -direct anterior   Clinical Impression   Patient evaluated by Occupational Therapy with no further acute OT needs identified. All education has been completed and the patient has no further questions. All education completed.  SHe has good family support and demonstrates good safety awareness.  See below for any follow-up Occupational Therapy or equipment needs. OT is signing off. Thank you for this referral.      Follow Up Recommendations  No OT follow up;Supervision/Assistance - 24 hour    Equipment Recommendations  None recommended by OT    Recommendations for Other Services       Precautions / Restrictions Precautions Precautions: Fall Restrictions Weight Bearing Restrictions: No      Mobility Bed Mobility Overal bed mobility: Needs Assistance                Transfers Overall transfer level: Needs assistance Equipment used: Rolling walker (2 wheeled) Transfers: Sit to/from Stand;Stand Pivot Transfers Sit to Stand: Supervision Stand pivot transfers: Supervision            Balance Overall balance assessment: No apparent balance deficits (not formally assessed)                                          ADL Overall ADL's : Needs assistance/impaired Eating/Feeding: Independent   Grooming: Wash/dry face;Wash/dry hands;Oral care;Supervision/safety;Standing   Upper Body Bathing: Set up;Sitting   Lower Body Bathing: Minimal assistance;Sit to/from stand   Upper Body Dressing : Set up;Sitting   Lower Body Dressing: Sit to/from stand;Moderate assistance Lower Body Dressing Details (indicate cue type and reason): Pt unable to access lt. LE - is able to reach to mid calf.  Encouraged pt. to attempt to perform dressing every day to improve ROM and function.  Only have family assist  if she is not able to perform Toilet Transfer: Supervision/safety;BSC;Comfort height toilet;RW   Toileting- Clothing Manipulation and Hygiene: Supervision/safety;Sit to/from stand     Tub/Shower Transfer Details (indicate cue type and reason): Pt deferred practice stating "my husband helps me".  She states she has a bench and can sit on the edge of the bench and swing her legs in  Functional mobility during ADLs: Supervision/safety;Rolling walker General ADL Comments: Discussed safety with BADLs.  Pt reports that she has no throw rugs at home. SHe is eager for discharge      Vision                     Perception     Praxis      Pertinent Vitals/Pain Pain Assessment: 0-10 Pain Score: 8  Pain Location: Lt hip Pain Descriptors / Indicators: Aching Pain Intervention(s): Monitored during session     Hand Dominance     Extremity/Trunk Assessment Upper Extremity Assessment Upper Extremity Assessment: Overall WFL for tasks assessed   Lower Extremity Assessment Lower Extremity Assessment: Defer to PT evaluation       Communication Communication Communication: No difficulties   Cognition Arousal/Alertness: Awake/alert Behavior During Therapy: WFL for tasks assessed/performed Overall Cognitive Status: Within Functional Limits for tasks assessed                     General Comments       Exercises  Shoulder Instructions      Home Living Family/patient expects to be discharged to:: Private residence Living Arrangements: Spouse/significant other Available Help at Discharge: Family Type of Home: Mobile home Home Access: Stairs to enter Entrance Stairs-Number of Steps: 2         Bathroom Shower/Tub: Tub/shower unit;Curtain Shower/tub characteristics: Engineer, building servicesCurtain Bathroom Toilet: Standard Bathroom Accessibility: Yes How Accessible: Accessible via walker Home Equipment: Dan HumphreysWalker - 2 wheels;Cane - single point;Bedside commode;Tub bench   Additional  Comments: Pt was initially vague about her tub seat, kept reapeating "my husband helps me", when asked further, she stated she had a bench with 2 legs in and 2 legs out of the tub and she can sit on the edge and pivot self in      Prior Functioning/Environment Level of Independence: Independent with assistive device(s)             OT Diagnosis: Generalized weakness;Acute pain   OT Problem List:     OT Treatment/Interventions:      OT Goals(Current goals can be found in the care plan section) Acute Rehab OT Goals Patient Stated Goal: to return to work  OT Goal Formulation: All assessment and education complete, DC therapy  OT Frequency:     Barriers to D/C:            Co-evaluation              End of Session Equipment Utilized During Treatment: Engineer, waterolling walker Nurse Communication: Mobility status  Activity Tolerance: Patient tolerated treatment well Patient left: in chair;with call bell/phone within reach   Time: 1154-1207 OT Time Calculation (min): 13 min Charges:  OT General Charges $OT Visit: 1 Procedure OT Evaluation $Initial OT Evaluation Tier I: 1 Procedure G-Codes:    Jeani Hawkingonarpe, Cresta Riden M 03/07/2014, 12:24 PM

## 2014-03-07 NOTE — Care Management Note (Signed)
    Page 1 of 1   03/07/2014     11:35:26 AM CARE MANAGEMENT NOTE 03/07/2014  Patient:  Lisa [name], Lisa [name]   Account Number:  [id number]  Date Initiated:  03/07/2014  Documentation initiated by:  [facility]  Subjective/Objective Assessment:   adm: LEFT TOTAL HIP ARTHROPLASTY ANTERIOR APPROACH (Left)     Action/Plan:   discharge planning   Anticipated DC Date:  03/07/2014   Anticipated DC Plan:  HOME/SELF CARE         Choice offered to / List presented to:          [facility] arranged  [place] - 11 Patient Refused      Status of service:  Completed, signed off Medicare Important Message given?   (If response is "NO", the following Medicare IM given date fields will be blank) Date Medicare IM given:   Medicare IM given by:   Date Additional Medicare IM given:   Additional Medicare IM given by:    Discharge Disposition:  HOME/SELF CARE  Per UR Regulation:    If discussed at Long Length of Stay Meetings, dates discussed:    Comments:  03/07/14 09:00 Cm met with pt in room to offer choice of home health agency.  Pt adamantly opposed to [facility] services and requests an outpt prescription she can use at an outpt clinic near her home.  Babish, PA wrote script for outpt PT.  Pt has all DME needed.  CM called [employer] rep, Tim to notify of refusal.  No other Cm needs were communicated. Mariane Masters, BSN, CM [phone].

## 2014-03-15 NOTE — Discharge Summary (Signed)
Physician Discharge Summary  Patient ID: Lisa Direngela R Krotzer MRN: [id number] DOB/AGE: 43/06/1970 43 y.o.  Admit date: 03/06/2014 Discharge date: 03/07/2014   Procedures:  Procedure(s) (LRB): LEFT TOTAL HIP ARTHROPLASTY ANTERIOR APPROACH (Left)  Attending Physician:  Dr. Durene RomansMatthew Olin   Admission Diagnoses:   Left hip primary OA / pain  Discharge Diagnoses:  Principal Problem:   S/P left THA. AA Active Problems:   Obese  Past Medical History  Diagnosis Date  . Diabetes mellitus without complication   . Hypertension   . Avascular necrosis left hip  . Arthritis     Left hip avascular necrosis    HPI: Lisa [name], 43 y.o. female, has a history of pain and functional disability in the left hip(s) due to arthritis and patient has failed non-surgical conservative treatments for greater than 12 weeks to include NSAID's and/or analgesics, corticosteriod injections, use of assistive devices and activity modification. Onset of symptoms was gradual starting 1+ years ago with gradually worsening course since that time.The patient noted prior procedures of the hip to include core decompression on the left hip on 10/09/2013 by Dr. Charlann Boxerlin. Patient currently rates pain in the left hip at 10 out of 10 with activity. Patient has night pain, worsening of pain with activity and weight bearing, trendelenberg gait, pain that interfers with activities of daily living and pain with passive range of motion. Patient has evidence of periarticular osteophytes and joint space narrowing by imaging studies. This condition presents safety issues increasing the risk of falls. There is no current active infection. Risks, benefits and expectations were discussed with the patient. Risks including but not limited to the risk of anesthesia, blood clots, nerve damage, blood vessel damage, failure of the prosthesis, infection and up to and including death. Patient understand the risks, benefits and expectations and  wishes to proceed with surgery.   PCP: Avon GullyFANTA,TESFAYE, MD   Discharged Condition: good  Hospital Course:  Patient underwent the above stated procedure on 03/06/2014. Patient tolerated the procedure well and brought to the recovery room in good condition and subsequently to the floor.  POD #1 BP: 104/63 ; Pulse: 70 ; Temp: 98.4 F (36.9 C) ; Resp: 16 Patient reports pain as mild, pain controlled. No events throughout the night. Ready to be discharged home. Dorsiflexion/plantar flexion intact, incision: dressing C/D/I, no cellulitis present and compartment soft.   LABS  Basename    HGB  9.8  HCT  29.9    Discharge Exam: General appearance: alert, cooperative and no distress Extremities: Homans sign is negative, no sign of DVT, no edema, redness or tenderness in the calves or thighs and no ulcers, gangrene or trophic changes  Disposition: Home with follow up in 2 weeks   Follow-up Information    Follow up with [facility].   Why:  3n1 (commode seat)   Contact information:   [address] [place] [id number] [phone]       Follow up with Shelda PalLIN,Arlyce Circle D, MD. Schedule an appointment as soon as possible for a visit in 2 weeks.   Specialty:  Orthopedic Surgery   Contact information:   [address] [place] [id number] [phone]       Discharge Instructions    Call MD / Call 911    Complete by:  As directed   If you experience chest pain or shortness of breath, CALL 911 and be transported to the hospital emergency room.  If you develope a  fever above 101 F, pus (white drainage) or increased drainage or redness at the wound, or calf pain, call your surgeon's office.     Change dressing    Complete by:  As directed   Maintain surgical dressing until follow up in the clinic. If the edges start to pull up, may reinforce with tape. If the dressing is no longer working, may remove and cover with gauze and tape, but must keep the  area dry and clean.  Call with any questions or concerns.     Constipation Prevention    Complete by:  As directed   Drink plenty of fluids.  Prune juice may be helpful.  You may use a stool softener, such as Colace (over the counter) 100 mg twice a day.  Use MiraLax (over the counter) for constipation as needed.     Diet - low sodium heart healthy    Complete by:  As directed      Discharge instructions    Complete by:  As directed   Maintain surgical dressing until follow up in the clinic. If the edges start to pull up, may reinforce with tape. If the dressing is no longer working, may remove and cover with gauze and tape, but must keep the area dry and clean.  Follow up in 2 weeks at [facility]. Call with any questions or concerns.     Increase activity slowly as tolerated    Complete by:  As directed      TED hose    Complete by:  As directed   Use stockings (TED hose) for 2 weeks on both leg(s).  You may remove them at night for sleeping.     Weight bearing as tolerated    Complete by:  As directed   Laterality:  left  Extremity:  Lower             Medication List    STOP taking these medications        acetaminophen 500 MG tablet  Commonly known as:  TYLENOL      TAKE these medications        aspirin 325 MG EC tablet  Take 1 tablet (325 mg total) by mouth 2 (two) times daily.     cyclobenzaprine 10 MG tablet  Commonly known as:  FLEXERIL  Take 1 tablet (10 mg total) by mouth 2 (two) times daily as needed for muscle spasms.     DSS 100 MG Caps  Take 100 mg by mouth 2 (two) times daily.     ferrous sulfate 325 (65 FE) MG tablet  Take 1 tablet (325 mg total) by mouth 3 (three) times daily after meals.     glyBURIDE-metformin 2.5-500 MG per tablet  Commonly known as:  GLUCOVANCE  Take 1 tablet by mouth 2 (two) times daily with a meal.     hydrochlorothiazide 12.5 MG capsule  Commonly known as:  MICROZIDE  Take 12.5 mg by mouth every morning.      HYDROcodone-acetaminophen 7.5-325 MG per tablet  Commonly known as:  NORCO  Take 1-2 tablets by mouth every 4 (four) hours as needed for moderate pain.     losartan-hydrochlorothiazide 50-12.5 MG per tablet  Commonly known as:  HYZAAR  Take 1 tablet by mouth every morning.     polyethylene glycol packet  Commonly known as:  MIRALAX / GLYCOLAX  Take 17 g by mouth 2 (two) times daily.         Signed: Molli Hazard  S. Chrystian Cupples   PA-C  03/15/2014, 12:30 PM

## 2015-07-05 DIAGNOSIS — M25551 Pain in right hip: Secondary | ICD-10-CM | POA: Diagnosis not present

## 2015-07-12 DIAGNOSIS — Z7984 Long term (current) use of oral hypoglycemic drugs: Secondary | ICD-10-CM | POA: Diagnosis not present

## 2015-07-12 DIAGNOSIS — E119 Type 2 diabetes mellitus without complications: Secondary | ICD-10-CM | POA: Diagnosis not present

## 2015-07-12 DIAGNOSIS — Z79899 Other long term (current) drug therapy: Secondary | ICD-10-CM | POA: Diagnosis not present

## 2015-07-12 DIAGNOSIS — F329 Major depressive disorder, single episode, unspecified: Secondary | ICD-10-CM | POA: Diagnosis not present

## 2015-07-12 DIAGNOSIS — L249 Irritant contact dermatitis, unspecified cause: Secondary | ICD-10-CM | POA: Diagnosis not present

## 2015-07-12 DIAGNOSIS — I10 Essential (primary) hypertension: Secondary | ICD-10-CM | POA: Diagnosis not present

## 2015-07-22 DIAGNOSIS — Z6841 Body Mass Index (BMI) 40.0 and over, adult: Secondary | ICD-10-CM | POA: Diagnosis not present

## 2015-07-22 DIAGNOSIS — Z1389 Encounter for screening for other disorder: Secondary | ICD-10-CM | POA: Diagnosis not present

## 2015-07-22 DIAGNOSIS — B355 Tinea imbricata: Secondary | ICD-10-CM | POA: Diagnosis not present

## 2015-07-22 DIAGNOSIS — L219 Seborrheic dermatitis, unspecified: Secondary | ICD-10-CM | POA: Diagnosis not present

## 2015-09-06 DIAGNOSIS — R58 Hemorrhage, not elsewhere classified: Secondary | ICD-10-CM | POA: Diagnosis not present

## 2015-09-06 DIAGNOSIS — I10 Essential (primary) hypertension: Secondary | ICD-10-CM | POA: Diagnosis not present

## 2015-09-06 DIAGNOSIS — Z79899 Other long term (current) drug therapy: Secondary | ICD-10-CM | POA: Diagnosis not present

## 2015-09-06 DIAGNOSIS — F17200 Nicotine dependence, unspecified, uncomplicated: Secondary | ICD-10-CM | POA: Diagnosis not present

## 2015-09-06 DIAGNOSIS — E119 Type 2 diabetes mellitus without complications: Secondary | ICD-10-CM | POA: Diagnosis not present

## 2015-09-06 DIAGNOSIS — F329 Major depressive disorder, single episode, unspecified: Secondary | ICD-10-CM | POA: Diagnosis not present

## 2015-10-22 ENCOUNTER — Other Ambulatory Visit: Payer: Self-pay | Admitting: *Deleted

## 2015-10-22 MED ORDER — CYCLOBENZAPRINE HCL 10 MG PO TABS: 10.0000 mg | ORAL_TABLET | Freq: Two times a day (BID) | ORAL | 5 refills | Status: DC | PRN

## 2015-11-04 ENCOUNTER — Ambulatory Visit (HOSPITAL_COMMUNITY)
Admission: RE | Admit: 2015-11-04 | Discharge: 2015-11-04 | Disposition: A | Discharge status: Home / Self Care | Payer: BLUE CROSS/BLUE SHIELD | Source: Ambulatory Visit | Attending: Family Medicine | Admitting: Family Medicine

## 2015-11-04 ENCOUNTER — Other Ambulatory Visit (HOSPITAL_COMMUNITY): Payer: Self-pay | Admitting: Family Medicine

## 2015-11-04 DIAGNOSIS — M25551 Pain in right hip: Secondary | ICD-10-CM | POA: Insufficient documentation

## 2015-11-04 DIAGNOSIS — M87859 Other osteonecrosis, unspecified femur: Secondary | ICD-10-CM | POA: Diagnosis not present

## 2015-11-04 DIAGNOSIS — Z1389 Encounter for screening for other disorder: Secondary | ICD-10-CM | POA: Diagnosis not present

## 2015-11-04 DIAGNOSIS — Z6841 Body Mass Index (BMI) 40.0 and over, adult: Secondary | ICD-10-CM | POA: Diagnosis not present

## 2015-12-02 ENCOUNTER — Ambulatory Visit (INDEPENDENT_AMBULATORY_CARE_PROVIDER_SITE_OTHER): Payer: BLUE CROSS/BLUE SHIELD | Admitting: Orthopedic Surgery

## 2015-12-02 ENCOUNTER — Encounter: Payer: Self-pay | Admitting: Orthopedic Surgery

## 2015-12-02 VITALS — BP 139/94 | HR 93 | Ht 64.0 in | Wt 250.0 lb

## 2015-12-02 DIAGNOSIS — M879 Osteonecrosis, unspecified: Secondary | ICD-10-CM | POA: Diagnosis not present

## 2015-12-02 DIAGNOSIS — M545 Low back pain, unspecified: Secondary | ICD-10-CM

## 2015-12-02 NOTE — Addendum Note (Signed)
Addended by: Adella HareBOOTHE, JAIME B on: 12/02/2015 05:20 PM   Modules accepted: Orders

## 2015-12-02 NOTE — Progress Notes (Signed)
Chief Complaint  Patient presents with  . Hip Pain    Right hip pain, xrays done at [facility].   HPI   The patient's symptoms started August 21. No injury. Pain is a 10 pain is constant. Worse with activity. She describes sharp aching pain. She had x-ray she's taking medication with some relief from the medication, Hydrocodone  Ankle leg swelling burning pain legs dizziness joint pain all other review of systems negative. History of diabetes and hypertension  History of surgery for AVN left total hip MRI on 2015 was read as  1. Bilateral femoral head avascular necrosis with associated marrow edema on the left. There is no significant subchondral collapse, joint effusion or secondary osteoarthritis. 2. There is some linear signal anteriorly in the right acetabular labrum which could reflect a nondisplaced tear. 3. Mild sacroiliac degenerative changes bilaterally.     Electronically Signed   By: Roxy Horseman M.D.   On: 04/05/2013 08:53    2015  Review of Systems  Cardiovascular: Positive for leg swelling.  Neurological: Positive for dizziness and tingling.  All other systems reviewed and are negative.   Past Medical History:  Diagnosis Date  . Arthritis    Left hip avascular necrosis  . Avascular necrosis left hip  . Diabetes mellitus without complication   . Hypertension     Past Surgical History:  Procedure Laterality Date  . ABDOMINAL HYSTERECTOMY    . DECOMPRESSION HIP-CORE Left 10/09/2013   Procedure: DECOMPRESSION LEFT HIP - CORE;  Surgeon: Shelda Pal, MD;  Location: [facility];  Service: Orthopedics;  Laterality: Left;  . HEMORRHOID SURGERY    . TOTAL HIP ARTHROPLASTY Left 03/06/2014   Procedure: LEFT TOTAL HIP ARTHROPLASTY ANTERIOR APPROACH;  Surgeon: Shelda Pal, MD;  Location: [facility];  Service: Orthopedics;  Laterality: Left;   No family history on file. Social History  Substance Use Topics  . Smoking status: Current Every Day Smoker    Packs/day:  0.25    Years: 1.00    Types: Cigarettes  . Smokeless tobacco: Never Used     Comment: occ. rare cigarette with anxiety  . Alcohol use No   Current Meds  Medication Sig  . cyclobenzaprine (FLEXERIL) 10 MG tablet Take 1 tablet (10 mg total) by mouth 2 (two) times daily as needed for muscle spasms.  Marland Kitchen docusate sodium 100 MG CAPS Take 100 mg by mouth 2 (two) times daily.  . ferrous sulfate 325 (65 FE) MG tablet Take 1 tablet (325 mg total) by mouth 3 (three) times daily after meals.  Marland Kitchen glyBURIDE-metformin (GLUCOVANCE) 2.5-500 MG per tablet Take 1 tablet by mouth 2 (two) times daily with a meal.  . hydrochlorothiazide (MICROZIDE) 12.5 MG capsule Take 12.5 mg by mouth every morning.  Marland Kitchen HYDROcodone-acetaminophen (NORCO) 7.5-325 MG per tablet Take 1-2 tablets by mouth every 4 (four) hours as needed for moderate pain.  . polyethylene glycol (MIRALAX / GLYCOLAX) packet Take 17 g by mouth 2 (two) times daily.    BP (!) 139/94   Pulse 93   Ht 5\' 4"  (1.626 m)   Wt 250 lb (113.4 kg)   BMI 42.91 kg/m   Physical Exam  Constitutional: She is oriented to person, place, and time. She appears well-developed and well-nourished. No distress.  Cardiovascular: Normal rate and intact distal pulses.   Neurological: She is alert and oriented to person, place, and time. She has normal reflexes. She exhibits normal muscle tone. Coordination normal.  Skin: Skin is  warm and dry. No rash noted. She is not diaphoretic. No erythema. No pallor.  Psychiatric: She has a normal mood and affect. Her behavior is normal. Judgment and thought content normal.   heavyset body habitus  Ortho Exam I find tenderness in the lower back but she denies radicular symptoms has a negative straight leg raise  She has mild pain in her right hip with flexion of the hip.  Leg lengths are equal. No instability is noted. She has normal strength of hip flexion skin is normal good distal pulses minimal edema normal sensation in the right  leg  Left leg excellent range of motion stability and strength status post total hip skin normal pulses good distal edema none sensation normal  ASSESSMENT: My personal interpretation of the images:  Plain films are done there is avascular necrosis of the hip  MRI lumbar spine from 2015 shows mild disc disease  MRI hips from 2015 show bilateral avascular necrosis   Encounter Diagnoses  Name Primary?  . Osteonecrosis of right hip (HCC) Yes  . Midline low back pain without sciatica     PLAN She would like to see Dr. Charlann Boxerlin again to have hip replacement surgery. Diagnosis avascular necrosis and lower back pain  Fuller CanadaStanley Jered Heiny, MD 12/02/2015 4:03 PM  .meds

## 2015-12-23 DIAGNOSIS — Z79899 Other long term (current) drug therapy: Secondary | ICD-10-CM | POA: Diagnosis not present

## 2015-12-23 DIAGNOSIS — E119 Type 2 diabetes mellitus without complications: Secondary | ICD-10-CM | POA: Diagnosis not present

## 2015-12-23 DIAGNOSIS — M25551 Pain in right hip: Secondary | ICD-10-CM | POA: Diagnosis not present

## 2015-12-23 DIAGNOSIS — M545 Low back pain: Secondary | ICD-10-CM | POA: Diagnosis not present

## 2015-12-23 DIAGNOSIS — F329 Major depressive disorder, single episode, unspecified: Secondary | ICD-10-CM | POA: Diagnosis not present

## 2015-12-23 DIAGNOSIS — I10 Essential (primary) hypertension: Secondary | ICD-10-CM | POA: Diagnosis not present

## 2015-12-23 DIAGNOSIS — Z7984 Long term (current) use of oral hypoglycemic drugs: Secondary | ICD-10-CM | POA: Diagnosis not present

## 2016-01-03 ENCOUNTER — Telehealth: Payer: Self-pay | Admitting: Orthopedic Surgery

## 2016-01-03 DIAGNOSIS — Z6841 Body Mass Index (BMI) 40.0 and over, adult: Secondary | ICD-10-CM | POA: Diagnosis not present

## 2016-01-03 DIAGNOSIS — Z1389 Encounter for screening for other disorder: Secondary | ICD-10-CM | POA: Diagnosis not present

## 2016-01-03 DIAGNOSIS — M545 Low back pain: Secondary | ICD-10-CM | POA: Diagnosis not present

## 2016-01-03 DIAGNOSIS — E669 Obesity, unspecified: Secondary | ICD-10-CM | POA: Diagnosis not present

## 2016-01-03 DIAGNOSIS — Z23 Encounter for immunization: Secondary | ICD-10-CM | POA: Diagnosis not present

## 2016-01-03 NOTE — Telephone Encounter (Signed)
Call [facility] AubreyLori [facility]; states patient is there for ongoing problem of right hip pain.  States aware that patient was seen by Dr Romeo AppleHarrison in September of 2017, following [facility], with Xrays, 11/04/15.  Asking if patient may come in to see Dr Romeo AppleHarrison today.  Per review, Dr Romeo AppleHarrison advises follow up with Dr Charlann Boxerlin, as per patient request at time of office visit with Dr Romeo AppleHarrison 12/02/15.  Relayed.  [facility].

## 2016-02-22 DIAGNOSIS — Z79899 Other long term (current) drug therapy: Secondary | ICD-10-CM | POA: Diagnosis not present

## 2016-02-22 DIAGNOSIS — Z7984 Long term (current) use of oral hypoglycemic drugs: Secondary | ICD-10-CM | POA: Diagnosis not present

## 2016-02-22 DIAGNOSIS — E669 Obesity, unspecified: Secondary | ICD-10-CM | POA: Diagnosis not present

## 2016-02-22 DIAGNOSIS — H1032 Unspecified acute conjunctivitis, left eye: Secondary | ICD-10-CM | POA: Diagnosis not present

## 2016-02-22 DIAGNOSIS — I10 Essential (primary) hypertension: Secondary | ICD-10-CM | POA: Diagnosis not present

## 2016-02-22 DIAGNOSIS — F17200 Nicotine dependence, unspecified, uncomplicated: Secondary | ICD-10-CM | POA: Diagnosis not present

## 2016-02-22 DIAGNOSIS — E119 Type 2 diabetes mellitus without complications: Secondary | ICD-10-CM | POA: Diagnosis not present

## 2016-02-29 DIAGNOSIS — H00011 Hordeolum externum right upper eyelid: Secondary | ICD-10-CM | POA: Diagnosis not present

## 2016-02-29 DIAGNOSIS — I10 Essential (primary) hypertension: Secondary | ICD-10-CM | POA: Diagnosis not present

## 2016-02-29 DIAGNOSIS — J069 Acute upper respiratory infection, unspecified: Secondary | ICD-10-CM | POA: Diagnosis not present

## 2016-02-29 DIAGNOSIS — F17200 Nicotine dependence, unspecified, uncomplicated: Secondary | ICD-10-CM | POA: Diagnosis not present

## 2016-02-29 DIAGNOSIS — H66001 Acute suppurative otitis media without spontaneous rupture of ear drum, right ear: Secondary | ICD-10-CM | POA: Diagnosis not present

## 2016-02-29 DIAGNOSIS — Z7984 Long term (current) use of oral hypoglycemic drugs: Secondary | ICD-10-CM | POA: Diagnosis not present

## 2016-02-29 DIAGNOSIS — Z79899 Other long term (current) drug therapy: Secondary | ICD-10-CM | POA: Diagnosis not present

## 2016-02-29 DIAGNOSIS — E669 Obesity, unspecified: Secondary | ICD-10-CM | POA: Diagnosis not present

## 2016-02-29 DIAGNOSIS — E119 Type 2 diabetes mellitus without complications: Secondary | ICD-10-CM | POA: Diagnosis not present

## 2016-06-06 DIAGNOSIS — M87059 Idiopathic aseptic necrosis of unspecified femur: Secondary | ICD-10-CM | POA: Diagnosis not present

## 2016-06-06 DIAGNOSIS — E119 Type 2 diabetes mellitus without complications: Secondary | ICD-10-CM | POA: Diagnosis not present

## 2016-06-06 DIAGNOSIS — I10 Essential (primary) hypertension: Secondary | ICD-10-CM | POA: Diagnosis not present

## 2016-06-06 DIAGNOSIS — E669 Obesity, unspecified: Secondary | ICD-10-CM | POA: Diagnosis not present

## 2016-06-06 DIAGNOSIS — L219 Seborrheic dermatitis, unspecified: Secondary | ICD-10-CM | POA: Diagnosis not present

## 2016-06-06 DIAGNOSIS — Z7984 Long term (current) use of oral hypoglycemic drugs: Secondary | ICD-10-CM | POA: Diagnosis not present

## 2016-06-06 DIAGNOSIS — Z79899 Other long term (current) drug therapy: Secondary | ICD-10-CM | POA: Diagnosis not present

## 2016-06-06 DIAGNOSIS — Z96642 Presence of left artificial hip joint: Secondary | ICD-10-CM | POA: Diagnosis not present

## 2016-06-06 DIAGNOSIS — M879 Osteonecrosis, unspecified: Secondary | ICD-10-CM | POA: Diagnosis not present

## 2016-06-06 DIAGNOSIS — M25551 Pain in right hip: Secondary | ICD-10-CM | POA: Diagnosis not present

## 2016-06-06 DIAGNOSIS — F17200 Nicotine dependence, unspecified, uncomplicated: Secondary | ICD-10-CM | POA: Diagnosis not present

## 2016-06-12 ENCOUNTER — Emergency Department (HOSPITAL_COMMUNITY)
Admission: EM | Admit: 2016-06-12 | Discharge: 2016-06-12 | Disposition: A | Discharge status: Home / Self Care | Payer: BLUE CROSS/BLUE SHIELD | Attending: Emergency Medicine | Admitting: Emergency Medicine

## 2016-06-12 ENCOUNTER — Emergency Department (HOSPITAL_COMMUNITY): Payer: BLUE CROSS/BLUE SHIELD

## 2016-06-12 ENCOUNTER — Encounter (HOSPITAL_COMMUNITY): Payer: Self-pay | Admitting: Emergency Medicine

## 2016-06-12 DIAGNOSIS — I10 Essential (primary) hypertension: Secondary | ICD-10-CM | POA: Diagnosis not present

## 2016-06-12 DIAGNOSIS — Z91010 Allergy to peanuts: Secondary | ICD-10-CM | POA: Diagnosis not present

## 2016-06-12 DIAGNOSIS — Z79899 Other long term (current) drug therapy: Secondary | ICD-10-CM | POA: Diagnosis not present

## 2016-06-12 DIAGNOSIS — Z91040 Latex allergy status: Secondary | ICD-10-CM | POA: Insufficient documentation

## 2016-06-12 DIAGNOSIS — M25551 Pain in right hip: Secondary | ICD-10-CM | POA: Diagnosis not present

## 2016-06-12 DIAGNOSIS — E119 Type 2 diabetes mellitus without complications: Secondary | ICD-10-CM | POA: Insufficient documentation

## 2016-06-12 DIAGNOSIS — M879 Osteonecrosis, unspecified: Secondary | ICD-10-CM | POA: Diagnosis not present

## 2016-06-12 DIAGNOSIS — F17210 Nicotine dependence, cigarettes, uncomplicated: Secondary | ICD-10-CM | POA: Insufficient documentation

## 2016-06-12 DIAGNOSIS — M87051 Idiopathic aseptic necrosis of right femur: Secondary | ICD-10-CM | POA: Diagnosis not present

## 2016-06-12 MED ORDER — PROMETHAZINE HCL 12.5 MG PO TABS
12.5000 mg | ORAL_TABLET | Freq: Once | ORAL | Status: AC
Start: 2016-06-12 — End: 2016-06-12
  Administered 2016-06-12: 12.5 mg via ORAL
  Filled 2016-06-12: qty 1

## 2016-06-12 MED ORDER — HYDROMORPHONE HCL 1 MG/ML IJ SOLN
1.0000 mg | Freq: Once | INTRAMUSCULAR | Status: AC
  Administered 2016-06-12: 1 mg via INTRAVENOUS
  Filled 2016-06-12: qty 1

## 2016-06-12 MED ORDER — METHOCARBAMOL 500 MG PO TABS: 500.0000 mg | ORAL_TABLET | Freq: Two times a day (BID) | ORAL | 0 refills | Status: DC

## 2016-06-12 MED ORDER — HYDROCODONE-ACETAMINOPHEN 5-325 MG PO TABS: 2.0000 | ORAL_TABLET | ORAL | 0 refills | Status: DC | PRN

## 2016-06-12 MED ORDER — KETOROLAC TROMETHAMINE 10 MG PO TABS
10.0000 mg | ORAL_TABLET | Freq: Once | ORAL | Status: AC
  Administered 2016-06-12: 10 mg via ORAL
  Filled 2016-06-12: qty 1

## 2016-06-12 NOTE — ED Triage Notes (Signed)
Patient complains of right hip pain. States hurts to walk. Patient states history of left hip replacement.

## 2016-06-12 NOTE — ED Provider Notes (Signed)
[facility] DEPT Provider Note   CSN: [id number] Arrival date & time: 06/12/16  1158     History   Chief Complaint Chief Complaint  Patient presents with  . Hip Pain    HPI Lisa [name] is a 46 y.o. female.  Patient is a 46 year old female who presents to the emergency department with complaint of right hip pain.  The patient states that she has been having some mild to moderate pain from time to time. This morning she awakened with severe right hip pain, and felt as though she could not get up out of the bed. She states that when she presses on the hip and it causes a limp pain. She walks with more of a limp this morning than usual. No recent injury to the hip area. No recent procedures noted. It is of note that the patient has a left hip replacement.      Past Medical History:  Diagnosis Date  . Arthritis    Left hip avascular necrosis  . Avascular necrosis (HCC) left hip  . Diabetes mellitus without complication (HCC)   . Hypertension     Patient Active Problem List   Diagnosis Date Noted  . Obese 03/07/2014  . 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 Left hip AVN 10/09/2013  . Osteonecrosis of hip (HCC) 05/30/2013    Past Surgical History:  Procedure Laterality Date  . ABDOMINAL HYSTERECTOMY    . DECOMPRESSION HIP-CORE Left 10/09/2013   Procedure: DECOMPRESSION LEFT HIP - CORE;  Surgeon: Shelda Pal, MD;  Location: [facility];  Service: Orthopedics;  Laterality: Left;  . HEMORRHOID SURGERY    . TOTAL HIP ARTHROPLASTY Left 03/06/2014   Procedure: LEFT TOTAL HIP ARTHROPLASTY ANTERIOR APPROACH;  Surgeon: Shelda Pal, MD;  Location: [facility];  Service: Orthopedics;  Laterality: Left;    OB History    No data available       Home Medications    Prior to Admission medications   Medication Sig Start Date End Date Taking? Authorizing Provider  cyclobenzaprine (FLEXERIL) 10 MG tablet Take 1 tablet (10 mg total) by mouth 2 (two) times daily as needed for muscle  spasms. 10/22/15   Vickki Hearing, MD  docusate sodium 100 MG CAPS Take 100 mg by mouth 2 (two) times daily. 10/10/13   Lanney Gins, PA-C  ferrous sulfate 325 (65 FE) MG tablet Take 1 tablet (325 mg total) by mouth 3 (three) times daily after meals. 10/10/13   Lanney Gins, PA-C  glyBURIDE-metformin (GLUCOVANCE) 2.5-500 MG per tablet Take 1 tablet by mouth 2 (two) times daily with a meal.    Historical Provider, MD  hydrochlorothiazide (MICROZIDE) 12.5 MG capsule Take 12.5 mg by mouth every morning.    Historical Provider, MD  HYDROcodone-acetaminophen (NORCO) 7.5-325 MG per tablet Take 1-2 tablets by mouth every 4 (four) hours as needed for moderate pain. 03/07/14   Lanney Gins, PA-C  polyethylene glycol (MIRALAX / GLYCOLAX) packet Take 17 g by mouth 2 (two) times daily. 10/10/13   Lanney Gins, PA-C    Family History No family history on file.  Social History Social History  Substance Use Topics  . Smoking status: Current Every Day Smoker    Packs/day: 0.25    Years: 1.00    Types: Cigarettes  . Smokeless tobacco: Never Used     Comment: occ. rare cigarette with anxiety  . Alcohol use No     Allergies   Latex; Peanut-containing drug products; Doxycycline; and Sulfa  antibiotics   Review of Systems Review of Systems  Constitutional: Negative for activity change.       All ROS Neg except as noted in HPI  HENT: Negative for nosebleeds.   Eyes: Negative for photophobia and discharge.  Respiratory: Negative for cough, shortness of breath and wheezing.   Cardiovascular: Negative for chest pain and palpitations.  Gastrointestinal: Negative for abdominal pain and blood in stool.  Genitourinary: Negative for dysuria, frequency and hematuria.  Musculoskeletal: Positive for arthralgias. Negative for back pain and neck pain.  Skin: Negative.   Neurological: Negative for dizziness, seizures and speech difficulty.  Psychiatric/Behavioral: Negative for confusion and  hallucinations.     Physical Exam Updated Vital Signs BP (!) 138/99 (BP Location: Right Arm)   Pulse 81   Temp 99.2 F (37.3 C) (Oral)   Resp 18   Ht  (1.676 m)   Wt 114.3 kg   SpO2 99%   BMI 40.67 kg/m   Physical Exam  Constitutional: She is oriented to person, place, and time. She appears well-developed and well-nourished.  Non-toxic appearance.  HENT:  Head: Normocephalic.  Right Ear: Tympanic membrane and external ear normal.  Left Ear: Tympanic membrane and external ear normal.  Eyes: EOM and lids are normal. Pupils are equal, round, and reactive to light.  Neck: Normal range of motion. Neck supple. Carotid bruit is not present.  Cardiovascular: Normal rate, regular rhythm, normal heart sounds, intact distal pulses and normal pulses.   Pulmonary/Chest: Breath sounds normal. No respiratory distress.  Abdominal: Soft. Bowel sounds are normal. There is no tenderness. There is no guarding.  Musculoskeletal: Normal range of motion.  Pain with any attempted range of motion of the right hip. Pain to palpation over the hip area. The hip is not warm to touch. There is no shortening of the lower extremities. There is good range of motion of the right knee, ankle, and toes. The dorsalis pedis pulses 2+.  Lymphadenopathy:       Head (right side): No submandibular adenopathy present.       Head (left side): No submandibular adenopathy present.    She has no cervical adenopathy.  Neurological: She is alert and oriented to person, place, and time. She has normal strength. No cranial nerve deficit or sensory deficit.  Skin: Skin is warm and dry.  Psychiatric: She has a normal mood and affect. Her speech is normal.  Nursing note and vitals reviewed.    ED Treatments / Results  Labs (all labs ordered are listed, but only abnormal results are displayed) Labs Reviewed - No data to display  EKG  EKG Interpretation None       Radiology Dg Hip Unilat  With Pelvis 2-3 Views  Right  Result Date: 06/12/2016 CLINICAL DATA:  Pain EXAM: DG HIP (WITH OR WITHOUT PELVIS) 2-3V RIGHT COMPARISON:  November 04, 2015 pelvis and right hip radiographs as well as MR pelvis April 04, 2013 FINDINGS: Frontal pelvis as well as frontal and lateral right hip images were obtained. There is a total hip replacement on the left with prosthetic components on the left well seated. On the right, there is no acute fracture or dislocation. There is subchondral cystic change in the right femoral head with areas of sclerosis indicative of avascular necrosis. The degree of avascular necrosis appears essentially stable compared to prior radiographic examination. IMPRESSION: Essentially stable changes of avascular necrosis in the right femoral head. Avascular necrosis in the right femoral head is well delineated on  prior MR examination. Dx total-hip replacement noted on the left. Prosthetic components well-seated. No acute fracture or dislocation evident on this study. Electronically Signed   By: Bretta Bang III M.D.   On: 06/12/2016 13:35    Procedures Procedures (including critical care time)  Medications Ordered in ED Medications - No data to display   Initial Impression / Assessment and Plan / ED Course  I have reviewed the triage vital signs and the nursing notes.  Pertinent labs & imaging results that were available during my care of the patient were reviewed by me and considered in my medical decision making (see chart for details).     *I have reviewed nursing notes, vital signs, and all appropriate lab and imaging results for this patient.**  Final Clinical Impressions(s) / ED Diagnoses MDM Temperature is 99.2. Blood pressure is elevated at 152/107. The pulse oximetry is 99%. X-ray of the hip and pelvis reveal the left prosthesis to be intact. There is no acute fracture noted on the plain film area there is noted avascular necrosis of the right femoral head.  Given the amount of  pain the patient had an also the inability to walk as usual, will obtain a CT scan of the hip and pelvis. Patient given medication for her pain while here in the emergency department.  CT result pending. Pt care to be continued by Langston Masker, PA-c.   Final diagnoses:  Right hip pain  Avascular necrosis of bone of hip, right [facility])    New Prescriptions Discharge Medication List as of 06/12/2016  7:12 PM       Ivery Quale, PA-C 06/12/16 1943    Donnetta Hutching, MD 06/13/16 1023

## 2016-06-12 NOTE — ED Notes (Signed)
Awaiting CT

## 2016-06-12 NOTE — Discharge Instructions (Signed)
See the Orthopaedist for evaluation of increased pain

## 2016-06-12 NOTE — ED Notes (Signed)
Meal provided 

## 2016-06-12 NOTE — ED Notes (Signed)
Diet coke provided apo.logy for wait

## 2016-06-12 NOTE — ED Notes (Signed)
In CT

## 2016-06-12 NOTE — ED Notes (Signed)
Pt reports that she awakened with R hip pain and it feels as if someone is pressing on it= She reports that she has had no injury to her knowledge Ambulates with a limp

## 2016-06-12 NOTE — ED Provider Notes (Signed)
Pt's ct of hip returned.  Ct shows findings consistent with avascular necrosis.  No acute abnormality/   Pt advised to see her Orthopaedist for recheck.  Pt given rx for hydrocodone.  Pt declined robaxin.  She has muscle relaxer at home.   Lonia Skinner [place], PA-C 06/12/16 1930    Vanetta Mulders, MD 06/14/16 1816

## 2016-08-27 DIAGNOSIS — E119 Type 2 diabetes mellitus without complications: Secondary | ICD-10-CM | POA: Diagnosis not present

## 2016-08-27 DIAGNOSIS — F17200 Nicotine dependence, unspecified, uncomplicated: Secondary | ICD-10-CM | POA: Diagnosis not present

## 2016-08-27 DIAGNOSIS — Z79899 Other long term (current) drug therapy: Secondary | ICD-10-CM | POA: Diagnosis not present

## 2016-08-27 DIAGNOSIS — Z7984 Long term (current) use of oral hypoglycemic drugs: Secondary | ICD-10-CM | POA: Diagnosis not present

## 2016-08-27 DIAGNOSIS — I10 Essential (primary) hypertension: Secondary | ICD-10-CM | POA: Diagnosis not present

## 2016-08-27 DIAGNOSIS — M5126 Other intervertebral disc displacement, lumbar region: Secondary | ICD-10-CM | POA: Diagnosis not present

## 2016-08-27 DIAGNOSIS — M5106 Intervertebral disc disorders with myelopathy, lumbar region: Secondary | ICD-10-CM | POA: Diagnosis not present

## 2016-08-27 DIAGNOSIS — M545 Low back pain: Secondary | ICD-10-CM | POA: Diagnosis not present

## 2016-08-28 DIAGNOSIS — M5410 Radiculopathy, site unspecified: Secondary | ICD-10-CM | POA: Diagnosis not present

## 2016-08-28 DIAGNOSIS — M545 Low back pain: Secondary | ICD-10-CM | POA: Diagnosis not present

## 2016-08-28 DIAGNOSIS — M5136 Other intervertebral disc degeneration, lumbar region: Secondary | ICD-10-CM | POA: Diagnosis not present

## 2016-08-28 DIAGNOSIS — E119 Type 2 diabetes mellitus without complications: Secondary | ICD-10-CM | POA: Diagnosis not present

## 2016-08-28 DIAGNOSIS — Z6841 Body Mass Index (BMI) 40.0 and over, adult: Secondary | ICD-10-CM | POA: Diagnosis not present

## 2016-09-25 DIAGNOSIS — M5410 Radiculopathy, site unspecified: Secondary | ICD-10-CM | POA: Diagnosis not present

## 2016-09-25 DIAGNOSIS — Z6841 Body Mass Index (BMI) 40.0 and over, adult: Secondary | ICD-10-CM | POA: Diagnosis not present

## 2016-09-25 DIAGNOSIS — M545 Low back pain: Secondary | ICD-10-CM | POA: Diagnosis not present

## 2016-09-25 DIAGNOSIS — Z1389 Encounter for screening for other disorder: Secondary | ICD-10-CM | POA: Diagnosis not present

## 2016-10-01 DIAGNOSIS — M5126 Other intervertebral disc displacement, lumbar region: Secondary | ICD-10-CM | POA: Diagnosis not present

## 2016-10-01 DIAGNOSIS — M47816 Spondylosis without myelopathy or radiculopathy, lumbar region: Secondary | ICD-10-CM | POA: Diagnosis not present

## 2016-10-01 DIAGNOSIS — M47896 Other spondylosis, lumbar region: Secondary | ICD-10-CM | POA: Diagnosis not present

## 2016-10-01 DIAGNOSIS — M5410 Radiculopathy, site unspecified: Secondary | ICD-10-CM | POA: Diagnosis not present

## 2016-10-01 DIAGNOSIS — M545 Low back pain: Secondary | ICD-10-CM | POA: Diagnosis not present

## 2016-10-01 DIAGNOSIS — M47897 Other spondylosis, lumbosacral region: Secondary | ICD-10-CM | POA: Diagnosis not present

## 2016-10-13 DIAGNOSIS — M545 Low back pain: Secondary | ICD-10-CM | POA: Diagnosis not present

## 2016-10-13 DIAGNOSIS — Z6841 Body Mass Index (BMI) 40.0 and over, adult: Secondary | ICD-10-CM | POA: Diagnosis not present

## 2016-10-15 ENCOUNTER — Other Ambulatory Visit (HOSPITAL_COMMUNITY): Payer: Self-pay | Admitting: Family Medicine

## 2016-10-15 DIAGNOSIS — Z1231 Encounter for screening mammogram for malignant neoplasm of breast: Secondary | ICD-10-CM

## 2016-10-22 DIAGNOSIS — Z79899 Other long term (current) drug therapy: Secondary | ICD-10-CM | POA: Diagnosis not present

## 2016-10-22 DIAGNOSIS — I10 Essential (primary) hypertension: Secondary | ICD-10-CM | POA: Diagnosis not present

## 2016-10-22 DIAGNOSIS — E119 Type 2 diabetes mellitus without complications: Secondary | ICD-10-CM | POA: Diagnosis not present

## 2016-10-22 DIAGNOSIS — Z7984 Long term (current) use of oral hypoglycemic drugs: Secondary | ICD-10-CM | POA: Diagnosis not present

## 2016-10-22 DIAGNOSIS — F17200 Nicotine dependence, unspecified, uncomplicated: Secondary | ICD-10-CM | POA: Diagnosis not present

## 2016-10-22 DIAGNOSIS — M5416 Radiculopathy, lumbar region: Secondary | ICD-10-CM | POA: Diagnosis not present

## 2016-10-22 DIAGNOSIS — M545 Low back pain: Secondary | ICD-10-CM | POA: Diagnosis not present

## 2016-10-22 DIAGNOSIS — M5136 Other intervertebral disc degeneration, lumbar region: Secondary | ICD-10-CM | POA: Diagnosis not present

## 2016-10-29 DIAGNOSIS — M87851 Other osteonecrosis, right femur: Secondary | ICD-10-CM | POA: Diagnosis not present

## 2016-10-29 DIAGNOSIS — Z6841 Body Mass Index (BMI) 40.0 and over, adult: Secondary | ICD-10-CM | POA: Diagnosis not present

## 2016-11-11 ENCOUNTER — Ambulatory Visit: Payer: BLUE CROSS/BLUE SHIELD | Admitting: Orthopaedic Surgery

## 2016-11-12 ENCOUNTER — Encounter: Payer: Self-pay | Admitting: Orthopaedic Surgery

## 2016-11-23 ENCOUNTER — Telehealth: Payer: Self-pay | Admitting: Gastroenterology

## 2016-11-23 NOTE — Telephone Encounter (Signed)
RECALL FOR TCS °

## 2016-11-23 NOTE — Telephone Encounter (Signed)
Letter mailed to pt.  

## 2016-11-27 ENCOUNTER — Ambulatory Visit: Payer: BLUE CROSS/BLUE SHIELD | Admitting: Orthopedic Surgery

## 2016-12-01 ENCOUNTER — Ambulatory Visit (INDEPENDENT_AMBULATORY_CARE_PROVIDER_SITE_OTHER): Payer: BLUE CROSS/BLUE SHIELD | Admitting: Orthopedic Surgery

## 2016-12-01 ENCOUNTER — Encounter: Payer: Self-pay | Admitting: Orthopedic Surgery

## 2016-12-01 VITALS — BP 138/103 | HR 75 | Ht 66.0 in | Wt 250.0 lb

## 2016-12-01 DIAGNOSIS — M5136 Other intervertebral disc degeneration, lumbar region: Secondary | ICD-10-CM | POA: Diagnosis not present

## 2016-12-01 DIAGNOSIS — M87051 Idiopathic aseptic necrosis of right femur: Secondary | ICD-10-CM

## 2016-12-01 DIAGNOSIS — Z96642 Presence of left artificial hip joint: Secondary | ICD-10-CM | POA: Diagnosis not present

## 2016-12-01 DIAGNOSIS — M48061 Spinal stenosis, lumbar region without neurogenic claudication: Secondary | ICD-10-CM | POA: Diagnosis not present

## 2016-12-01 NOTE — Progress Notes (Signed)
Patient ID: Lisa [name], female   DOB: 12-15-1970, 46 y.o.   MRN: [id number]  Chief Complaint  Patient presents with  . Follow-up    ER follow up from [facility], back and right hip pain, no injury.    HPI Lisa [name] is a 46 y.o. female.   46 year old female history of avascular necrosis both hips status post left total hip arthroplasty. Presents for evaluation of right lower extremity pain present for  She denies any trauma.  She complains of radiating right leg pain starting in the lower back radiating across the hip and upper leg associated with weakness giving way dull aching constant pain right lower extremity. She had an x-ray of her hip which showed stable avascular necrosis no joint involvement in terms of degeneration  She had a CT scan of her lower back which shows spinal stenosis and degenerative disc disease  Current medications include a course of hydrocodone and then tramadol as well as Flexeril.  Weakness in the right leg is causing the need supportive devices to help her walk     Review of Systems Review of Systems  Constitutional: Negative for fever and unexpected weight change.  Gastrointestinal: Negative.   Genitourinary: Negative.   Musculoskeletal: Positive for arthralgias, back pain, gait problem and myalgias.  Neurological: Positive for numbness.    Past Medical History:  Diagnosis Date  . Arthritis    Left hip avascular necrosis  . Avascular necrosis (HCC) left hip  . Diabetes mellitus without complication (HCC)   . Hypertension     Past Surgical History:  Procedure Laterality Date  . ABDOMINAL HYSTERECTOMY    . DECOMPRESSION HIP-CORE Left 10/09/2013   Procedure: DECOMPRESSION LEFT HIP - CORE;  Surgeon: Shelda Pal, MD;  Location: [facility];  Service: Orthopedics;  Laterality: Left;  . HEMORRHOID SURGERY    . TOTAL HIP ARTHROPLASTY Left 03/06/2014   Procedure: LEFT TOTAL HIP ARTHROPLASTY ANTERIOR APPROACH;  Surgeon: Shelda Pal, MD;   Location: [facility];  Service: Orthopedics;  Laterality: Left;    History reviewed. No pertinent family history. was reviewed  Social History Social History  Substance Use Topics  . Smoking status: Current Every Day Smoker    Packs/day: 0.25    Years: 1.00    Types: Cigarettes  . Smokeless tobacco: Never Used     Comment: occ. rare cigarette with anxiety  . Alcohol use No    Allergies  Allergen Reactions  . Latex Anaphylaxis    And peanuts as well  . Peanut-Containing Drug Products Anaphylaxis and Hives  . Doxycycline Hives  . Sulfa Antibiotics Hives    Current Outpatient Prescriptions  Medication Sig Dispense Refill  . cyclobenzaprine (FLEXERIL) 10 MG tablet Take 1 tablet (10 mg total) by mouth 2 (two) times daily as needed for muscle spasms. 180 tablet 5  . docusate sodium 100 MG CAPS Take 100 mg by mouth 2 (two) times daily. 10 capsule 0  . ferrous sulfate 325 (65 FE) MG tablet Take 1 tablet (325 mg total) by mouth 3 (three) times daily after meals.  3  . glyBURIDE-metformin (GLUCOVANCE) 2.5-500 MG per tablet Take 1 tablet by mouth 2 (two) times daily with a meal.    . hydrochlorothiazide (MICROZIDE) 12.5 MG capsule Take 12.5 mg by mouth every morning.    Marland Kitchen HYDROcodone-acetaminophen (NORCO/VICODIN) 5-325 MG tablet Take 2 tablets by mouth every 4 (four) hours as needed. 20 tablet 0  . methocarbamol (ROBAXIN) 500 MG tablet Take 1 tablet (  500 mg total) by mouth 2 (two) times daily. 20 tablet 0  . polyethylene glycol (MIRALAX / GLYCOLAX) packet Take 17 g by mouth 2 (two) times daily. 14 each 0   No current facility-administered medications for this visit.        Physical Exam Physical Exam  Musculoskeletal:  Overall appearance normal grooming and hygiene., mild obesity Oriented person place and time normal Mood and affect flat Gait noticeable limp    Ortho Exam Tenderness right lower back Positive straight leg raise even in the seated position  Left hip range of  motion normal with prosthesis, no strength deficits no instability. Skin normal. No tenderness.   Right hip passive range of motion normal Right hip stable Weakness right leg global Skin right leg normal Decreased sensation right lower extremity  Reflexes were 2+ and equal   Babinski's tests were down going  The vascular examination revealed normal dorsalis pedis pulses in both feet and both feet were warm with good capillary refill   Data Reviewed I interpret her imaging studies as follows CT scan shows degenerative disc disease spinal stenosis  Hip x-ray shows stable a van on the right total hip stable on the left  Imaging reports have been reviewed summation as follows Degenerative disc bulge facet arthritis L3-4 through S1 on the CT scan and then on the x-ray of the hip stable appearance right femoral head AVN since 2017 no osseous abnormality identified left hip arthroplasty appears normal. Assessment  Encounter Diagnoses  Name Primary?  . DDD (degenerative disc disease), lumbar Yes  . Spinal stenosis of lumbar region, unspecified whether neurogenic claudication present   . Avascular necrosis of bone of hip, right (HCC)   . H/O total hip arthroplasty, left       Plan  Referral to neurosurgery   Fuller Canada, MD 12/01/2016 4:06 PM

## 2016-12-11 DIAGNOSIS — G894 Chronic pain syndrome: Secondary | ICD-10-CM | POA: Diagnosis not present

## 2016-12-11 DIAGNOSIS — E119 Type 2 diabetes mellitus without complications: Secondary | ICD-10-CM | POA: Diagnosis not present

## 2016-12-11 DIAGNOSIS — Z1389 Encounter for screening for other disorder: Secondary | ICD-10-CM | POA: Diagnosis not present

## 2016-12-11 DIAGNOSIS — Z6841 Body Mass Index (BMI) 40.0 and over, adult: Secondary | ICD-10-CM | POA: Diagnosis not present

## 2016-12-24 ENCOUNTER — Encounter: Payer: Self-pay | Admitting: Radiology

## 2017-01-15 DIAGNOSIS — Z6841 Body Mass Index (BMI) 40.0 and over, adult: Secondary | ICD-10-CM | POA: Diagnosis not present

## 2017-01-15 DIAGNOSIS — Z23 Encounter for immunization: Secondary | ICD-10-CM | POA: Diagnosis not present

## 2017-01-15 DIAGNOSIS — Z1389 Encounter for screening for other disorder: Secondary | ICD-10-CM | POA: Diagnosis not present

## 2017-01-15 DIAGNOSIS — M87851 Other osteonecrosis, right femur: Secondary | ICD-10-CM | POA: Diagnosis not present

## 2017-01-30 DIAGNOSIS — F17200 Nicotine dependence, unspecified, uncomplicated: Secondary | ICD-10-CM | POA: Diagnosis not present

## 2017-01-30 DIAGNOSIS — I10 Essential (primary) hypertension: Secondary | ICD-10-CM | POA: Diagnosis not present

## 2017-01-30 DIAGNOSIS — E119 Type 2 diabetes mellitus without complications: Secondary | ICD-10-CM | POA: Diagnosis not present

## 2017-01-30 DIAGNOSIS — M25551 Pain in right hip: Secondary | ICD-10-CM | POA: Diagnosis not present

## 2017-01-30 DIAGNOSIS — M5431 Sciatica, right side: Secondary | ICD-10-CM | POA: Diagnosis not present

## 2017-01-30 DIAGNOSIS — M549 Dorsalgia, unspecified: Secondary | ICD-10-CM | POA: Diagnosis not present

## 2017-03-06 DIAGNOSIS — G8929 Other chronic pain: Secondary | ICD-10-CM | POA: Diagnosis not present

## 2017-03-06 DIAGNOSIS — F17200 Nicotine dependence, unspecified, uncomplicated: Secondary | ICD-10-CM | POA: Diagnosis not present

## 2017-03-06 DIAGNOSIS — M25551 Pain in right hip: Secondary | ICD-10-CM | POA: Diagnosis not present

## 2017-03-06 DIAGNOSIS — E119 Type 2 diabetes mellitus without complications: Secondary | ICD-10-CM | POA: Diagnosis not present

## 2017-03-06 DIAGNOSIS — E669 Obesity, unspecified: Secondary | ICD-10-CM | POA: Diagnosis not present

## 2017-03-12 DIAGNOSIS — Z6841 Body Mass Index (BMI) 40.0 and over, adult: Secondary | ICD-10-CM | POA: Diagnosis not present

## 2017-03-12 DIAGNOSIS — Z1389 Encounter for screening for other disorder: Secondary | ICD-10-CM | POA: Diagnosis not present

## 2017-03-12 DIAGNOSIS — M87851 Other osteonecrosis, right femur: Secondary | ICD-10-CM | POA: Diagnosis not present

## 2017-04-13 DIAGNOSIS — M25551 Pain in right hip: Secondary | ICD-10-CM | POA: Diagnosis not present

## 2017-04-22 DIAGNOSIS — M25551 Pain in right hip: Secondary | ICD-10-CM | POA: Diagnosis not present

## 2017-04-26 DIAGNOSIS — M25551 Pain in right hip: Secondary | ICD-10-CM | POA: Diagnosis not present

## 2017-04-29 DIAGNOSIS — M25551 Pain in right hip: Secondary | ICD-10-CM | POA: Diagnosis not present

## 2017-05-05 DIAGNOSIS — M87851 Other osteonecrosis, right femur: Secondary | ICD-10-CM | POA: Diagnosis not present

## 2017-05-05 DIAGNOSIS — Z1389 Encounter for screening for other disorder: Secondary | ICD-10-CM | POA: Diagnosis not present

## 2017-05-05 DIAGNOSIS — Z6841 Body Mass Index (BMI) 40.0 and over, adult: Secondary | ICD-10-CM | POA: Diagnosis not present

## 2017-05-17 DIAGNOSIS — I10 Essential (primary) hypertension: Secondary | ICD-10-CM | POA: Diagnosis present

## 2017-05-17 DIAGNOSIS — F17200 Nicotine dependence, unspecified, uncomplicated: Secondary | ICD-10-CM | POA: Diagnosis present

## 2017-05-17 DIAGNOSIS — M1611 Unilateral primary osteoarthritis, right hip: Secondary | ICD-10-CM | POA: Diagnosis present

## 2017-05-17 DIAGNOSIS — E119 Type 2 diabetes mellitus without complications: Secondary | ICD-10-CM

## 2017-05-17 NOTE — H&P (Signed)
PREOPERATIVE H&P Patient ID: Lisa [name] MRN: [id number] DOB/AGE: 10/18/1970 47 y.o.  Chief Complaint: OA RIGHT HIP  Planned Procedure Date: 06/08/2017 Medical and cardiac clearance by Dr. Phillips Odor  HPI: Lisa [name] is a 47 y.o. female with a history of diabetes, hypertension, DjD, and previous left total hip arthroplasty for AVN with known right hip AVN/flattening who presents for evaluation of OA RIGHT HIP. The patient has a history of pain and functional disability in the right hip due to arthritis and has failed non-surgical conservative treatments for greater than 12 weeks to include NSAID's and/or analgesics, corticosteriod injections, use of assistive devices and activity modification.  Onset of symptoms was gradual, starting 3 years ago with gradually worsening course since that time.  Patient currently rates pain at 9 out of 10 with activity. Patient has night pain, worsening of pain with activity and weight bearing, pain that interferes with activities of daily living and pain with passive range of motion.  Patient has evidence of AVN/flattening, subchondral cysts and joint space narrowing on XR and MRI.  There is no active infection.  Past Medical History:  Diagnosis Date  . Arthritis    Left hip avascular necrosis  . Avascular necrosis (HCC) left hip  . Diabetes mellitus without complication (HCC)   . Hypertension    Past Surgical History:  Procedure Laterality Date  . ABDOMINAL HYSTERECTOMY    . DECOMPRESSION HIP-CORE Left 10/09/2013   Procedure: DECOMPRESSION LEFT HIP - CORE;  Surgeon: Shelda Pal, MD;  Location: [facility];  Service: Orthopedics;  Laterality: Left;  . HEMORRHOID SURGERY    . TOTAL HIP ARTHROPLASTY Left 03/06/2014   Procedure: LEFT TOTAL HIP ARTHROPLASTY ANTERIOR APPROACH;  Surgeon: Shelda Pal, MD;  Location: [facility];  Service: Orthopedics;  Laterality: Left;   Allergies  Allergen Reactions  . Latex Anaphylaxis    And peanuts as well  .  Peanut-Containing Drug Products Anaphylaxis and Hives  . Doxycycline Hives  . Sulfa Antibiotics Hives   Medications: Citalopram 10 mg daily  Hydrocodone 10/325 up to twice daily as needed Cyclobenzaprine 10 mg twice daily as needed Glyburide/metformin 5/500 mg twice daily Losartan 50 mg daily  Social history: Married recent smoker-3 cigarettes/day x 1 year.  No EtOH.  Family history: Mother with HTN, DM  ROS: Currently denies lightheadedness, dizziness, Fever, chills, CP, SOB.   No personal history of DVT, PE, MI, or CVA. No loose teeth or dentures All other systems have been reviewed and were otherwise currently negative with the exception of those mentioned in the HPI and as above.  Objective: Vitals: Ht: 5 feet 6 inches wt: 252 temp: 98.7 BP: 158/98 pulse: 91 O2 91 % on room air. Physical Exam: General: Alert, NAD.  Trendelenberg Gait-utilizes cane. HEENT: EOMI, Good Neck Extension  Pulm: No increased work of breathing.  Clear B/L A/P w/o crackle or wheeze.  CV: RRR, No m/g/r appreciated  GI: soft, NT, ND Neuro: Neuro without gross focal deficit.  Sensation intact distally Skin: No lesions in the area of chief complaint MSK/Surgical Site: Right hip Non tender over greater trochanter.  Pain with passive ROM.  Positive Stinchfield.  5/5 strength.  NVI.  Sensation intact distally.   Imaging Review Plain radiographs and MRI demonstrate severe degenerative joint disease of the right hip.   Assessment: OA RIGHT HIP Principal Problem:   Primary osteoarthritis of right hip Active Problems:   Tobacco use disorder   Diabetes (HCC)   Essential hypertension  Plan: Plan for Procedure(s): TOTAL HIP ARTHROPLASTY ANTERIOR APPROACH  The patient history, physical exam, clinical judgement of the provider and imaging are consistent with end stage degenerative joint disease and total joint arthroplasty is deemed medically necessary. The treatment options including medical  management, injection therapy, and arthroplasty were discussed at length. The risks and benefits of Procedure(s): TOTAL HIP ARTHROPLASTY ANTERIOR APPROACH were presented and reviewed.  The risks of nonoperative treatment, versus surgical intervention including but not limited to continued pain, aseptic loosening, stiffness, dislocation/subluxation, infection, bleeding, nerve injury, blood clots, cardiopulmonary complications, morbidity, mortality, among others were discussed. The patient verbalizes understanding and wishes to proceed with the plan.  Patient is being admitted for inpatient treatment for surgery, pain control, PT, OT, prophylactic antibiotics, VTE prophylaxis, progressive ambulation, ADL's and discharge planning.   Dental prophylaxis discussed and recommended for 2 years postoperatively.   The patient does meet the criteria for TXA which will be used perioperatively.    ASA 325 mg will be used postoperatively for DVT prophylaxis in addition to SCDs, and early ambulation.  Plan for Norco, Celebrex post op for pain control  Consider incisional vac dt h/o smoking, DM, obesity  The patient is planning to be discharged home with home health services [facility] in care of her husband, mother, daughter and other family members.  Severity of Illness: The appropriate patient status for this patient is INPATIENT. Inpatient status is judged to be reasonable and necessary in order to provide the required intensity of service to ensure the patient's safety. The patient's presenting symptoms, physical exam findings, and initial radiographic and laboratory data in the context of their chronic comorbidities is felt to place them at high risk for further clinical deterioration. Furthermore, it is not anticipated that the patient will be medically stable for discharge from the hospital within 2 midnights of admission. The following factors support the patient status of inpatient.    * I  certify that at the point of admission it is my clinical judgment that the patient will require inpatient hospital care spanning beyond 2 midnights from the point of admission due to high intensity of service, high risk for further deterioration and high frequency of surveillance required.Albina Billet*   Banita Lehn Calvin Martensen III, PA-C 05/17/2017 3:35 PM

## 2017-05-27 NOTE — Pre-Procedure Instructions (Signed)
Brittannie Tawney Sones  05/27/2017      [facility] - [place], [place] - [address] [place] [address] Phone: [phone] Fax: [phone]    Your procedure is scheduled on June 08, 2017.  Report to [facility] Admitting at 1000 AM.  Call this number if you have problems the morning of surgery:  [phone]   Remember:  Do not eat food or drink liquids after midnight.  Take these medicines the morning of surgery with A SIP OF WATER citalopram (celexa), cyclopenzaprine (flexeril), hydrocodone-acetaminophen (norco)-if needed for pain.  7 days prior to surgery STOP taking any Aspirin (unless otherwise instructed by your surgeon), Aleve, Naproxen, Ibuprofen, Motrin, Advil, Goody's, BC's, all herbal medications, fish oil, and all vitamins  Continue all other medications as instructed by your physician except follow the above medication instructions before surgery   WHAT DO I DO ABOUT MY DIABETES MEDICATION?  Marland Kitchen Do not take oral diabetes medicines (pills) the morning of surgery glyburide-metformin (glucovance).  Reviewed and Endorsed by [facility] Patient Education Committee, August 2015  How to Manage Your Diabetes Before and After Surgery  Why is it important to control my blood sugar before and after surgery? . Improving blood sugar levels before and after surgery helps healing and can limit problems. . A way of improving blood sugar control is eating a healthy diet by: o  Eating less sugar and carbohydrates o  Increasing activity/exercise o  Talking with your doctor about reaching your blood sugar goals . High blood sugars (greater than 180 mg/dL) can raise your risk of infections and slow your recovery, so you will need to focus on controlling your diabetes during the weeks before surgery. . Make sure that the doctor who takes care of your diabetes knows about your planned surgery including the date and location.  How do I manage my  blood sugar before surgery? . Check your blood sugar at least 4 times a day, starting 2 days before surgery, to make sure that the level is not too high or low. o Check your blood sugar the morning of your surgery when you wake up and every 2 hours until you get to the Short Stay unit. . If your blood sugar is less than 70 mg/dL, you will need to treat for low blood sugar: o Do not take insulin. o Treat a low blood sugar (less than 70 mg/dL) with  cup of clear juice (cranberry or apple), 4 glucose tablets, OR glucose gel. Recheck blood sugar in 15 minutes after treatment (to make sure it is greater than 70 mg/dL). If your blood sugar is not greater than 70 mg/dL on recheck, call [phone] o  for further instructions. . Report your blood sugar to the short stay nurse when you get to Short Stay.  . If you are admitted to the hospital after surgery: o Your blood sugar will be checked by the staff and you will probably be given insulin after surgery (instead of oral diabetes medicines) to make sure you have good blood sugar levels. o The goal for blood sugar control after surgery is 80-180 mg/dL.   Do not wear jewelry, make-up or nail polish.  Do not wear lotions, powders, or perfumes, or deodorant.  Do not shave 48 hours prior to surgery.    Do not bring valuables to the hospital.  [facility] is not responsible for any belongings or valuables.  Contacts, dentures or bridgework may not  be worn into surgery.  Leave your suitcase in the car.  After surgery it may be brought to your room.  For patients admitted to the hospital, discharge time will be determined by your treatment team.  Patients discharged the day of surgery will not be allowed to drive home.   [facility]- Preparing For Surgery  Before surgery, you can play an important role. Because skin is not sterile, your skin needs to be as free of germs as possible. You can reduce the number of germs on your skin by washing with CHG  (chlorahexidine gluconate) Soap before surgery.  CHG is an antiseptic cleaner which kills germs and bonds with the skin to continue killing germs even after washing.  Please do not use if you have an allergy to CHG or antibacterial soaps. If your skin becomes reddened/irritated stop using the CHG.  Do not shave (including legs and underarms) for at least 48 hours prior to first CHG shower. It is OK to shave your face.  Please follow these instructions carefully.   1. Shower the NIGHT BEFORE SURGERY and the MORNING OF SURGERY with CHG.   2. If you chose to wash your hair, wash your hair first as usual with your normal shampoo.  3. After you shampoo, rinse your hair and body thoroughly to remove the shampoo.  4. Use CHG as you would any other liquid soap. You can apply CHG directly to the skin and wash gently with a scrungie or a clean washcloth.   5. Apply the CHG Soap to your body ONLY FROM THE NECK DOWN.  Do not use on open wounds or open sores. Avoid contact with your eyes, ears, mouth and genitals (private parts). Wash Face and genitals (private parts)  with your normal soap.  6. Wash thoroughly, paying special attention to the area where your surgery will be performed.  7. Thoroughly rinse your body with warm water from the neck down.  8. DO NOT shower/wash with your normal soap after using and rinsing off the CHG Soap.  9. Pat yourself dry with a CLEAN TOWEL.  10. Wear CLEAN PAJAMAS to bed the night before surgery, wear comfortable clothes the morning of surgery  11. Place CLEAN SHEETS on your bed the night of your first shower and DO NOT SLEEP WITH PETS.   Day of Surgery: Do not apply any deodorants/lotions. Please wear clean clothes to the hospital/surgery center.     Please read over the following fact sheets that you were given. Pain Booklet, Coughing and Deep Breathing, MRSA Information and Surgical Site Infection Prevention

## 2017-05-28 ENCOUNTER — Other Ambulatory Visit: Payer: Self-pay

## 2017-05-28 ENCOUNTER — Encounter (HOSPITAL_COMMUNITY): Payer: Self-pay

## 2017-05-28 ENCOUNTER — Encounter (HOSPITAL_COMMUNITY)
Admission: RE | Admit: 2017-05-28 | Discharge: 2017-05-28 | Disposition: A | Discharge status: Home / Self Care | Payer: BLUE CROSS/BLUE SHIELD | Source: Ambulatory Visit | Attending: Orthopedic Surgery | Admitting: Orthopedic Surgery

## 2017-05-28 DIAGNOSIS — Z01810 Encounter for preprocedural cardiovascular examination: Secondary | ICD-10-CM | POA: Diagnosis not present

## 2017-05-28 DIAGNOSIS — M1611 Unilateral primary osteoarthritis, right hip: Secondary | ICD-10-CM | POA: Insufficient documentation

## 2017-05-28 DIAGNOSIS — Z01812 Encounter for preprocedural laboratory examination: Secondary | ICD-10-CM | POA: Insufficient documentation

## 2017-05-28 HISTORY — DX: Myoneural disorder, unspecified: G70.9

## 2017-05-28 LAB — HEMOGLOBIN A1C
Hgb A1c MFr Bld: 6.5 % — ABNORMAL HIGH (ref 4.8–5.6)
Mean Plasma Glucose: 139.85 mg/dL

## 2017-05-28 LAB — BASIC METABOLIC PANEL
Anion gap: 9 (ref 5–15)
BUN: 15 mg/dL (ref 6–20)
CHLORIDE: 106 mmol/L (ref 101–111)
CO2: 27 mmol/L (ref 22–32)
CREATININE: 1.02 mg/dL — AB (ref 0.44–1.00)
Calcium: 9.7 mg/dL (ref 8.9–10.3)
GFR calc Af Amer: >60 mL/min (ref >60)
GFR calc non Af Amer: >60 mL/min (ref >60)
GLUCOSE: 60 mg/dL — AB (ref 65–99)
POTASSIUM: 3.7 mmol/L (ref 3.5–5.1)
Sodium: 142 mmol/L (ref 135–145)

## 2017-05-28 LAB — SURGICAL PCR SCREEN
MRSA, PCR: NEGATIVE
STAPHYLOCOCCUS AUREUS: NEGATIVE

## 2017-05-28 LAB — CBC
HEMATOCRIT: 38.4 % (ref 36.0–46.0)
Hemoglobin: 12.1 g/dL (ref 12.0–15.0)
MCH: 29.5 pg (ref 26.0–34.0)
MCHC: 31.5 g/dL (ref 30.0–36.0)
MCV: 93.7 fL (ref 78.0–100.0)
PLATELETS: 232 10*3/uL (ref 150–400)
RBC: 4.1 MIL/uL (ref 3.87–5.11)
RDW: 13.5 % (ref 11.5–15.5)
WBC: 5.4 10*3/uL (ref 4.0–10.5)

## 2017-05-28 LAB — GLUCOSE, CAPILLARY: Glucose-Capillary: 100 mg/dL — ABNORMAL HIGH (ref 65–99)

## 2017-05-28 NOTE — Progress Notes (Addendum)
PCP: Assunta FoundJohn Golding, MD  Cardiologist: pt denies  EKG: pt denies past year, obtained today  Stress test: pt denies  ECHO: pt denies ever  Cardiac Cath: pt denies ever  Chest x-ray: pt denies past year, no recent respiratory complication/infections

## 2017-05-31 DIAGNOSIS — Z6841 Body Mass Index (BMI) 40.0 and over, adult: Secondary | ICD-10-CM | POA: Diagnosis not present

## 2017-05-31 DIAGNOSIS — M87859 Other osteonecrosis, unspecified femur: Secondary | ICD-10-CM | POA: Diagnosis not present

## 2017-05-31 DIAGNOSIS — Z23 Encounter for immunization: Secondary | ICD-10-CM | POA: Diagnosis not present

## 2017-06-07 MED ORDER — TRANEXAMIC ACID 1000 MG/10ML IV SOLN
2000.0000 mg | Freq: Once | INTRAVENOUS | Status: DC
  Filled 2017-06-07: qty 20

## 2017-06-07 MED ORDER — TRANEXAMIC ACID 1000 MG/10ML IV SOLN
1000.0000 mg | INTRAVENOUS | Status: AC
  Administered 2017-06-08: 1000 mg via INTRAVENOUS
  Filled 2017-06-07: qty 1100

## 2017-06-08 ENCOUNTER — Inpatient Hospital Stay (HOSPITAL_COMMUNITY): Payer: BLUE CROSS/BLUE SHIELD

## 2017-06-08 ENCOUNTER — Inpatient Hospital Stay (HOSPITAL_COMMUNITY): Payer: BLUE CROSS/BLUE SHIELD | Admitting: Anesthesiology

## 2017-06-08 ENCOUNTER — Encounter (HOSPITAL_COMMUNITY)
Admission: RE | Disposition: A | Discharge status: Home / Self Care | Payer: Self-pay | Source: Ambulatory Visit | Attending: Orthopedic Surgery

## 2017-06-08 ENCOUNTER — Inpatient Hospital Stay (HOSPITAL_COMMUNITY)
Admission: RE | Admit: 2017-06-08 | Discharge: 2017-06-09 | DRG: 470 | Disposition: A | Discharge status: Home / Self Care | Payer: BLUE CROSS/BLUE SHIELD | Source: Ambulatory Visit | Attending: Orthopedic Surgery | Admitting: Orthopedic Surgery

## 2017-06-08 DIAGNOSIS — Z833 Family history of diabetes mellitus: Secondary | ICD-10-CM | POA: Diagnosis not present

## 2017-06-08 DIAGNOSIS — Z79899 Other long term (current) drug therapy: Secondary | ICD-10-CM | POA: Diagnosis not present

## 2017-06-08 DIAGNOSIS — Z87892 Personal history of anaphylaxis: Secondary | ICD-10-CM

## 2017-06-08 DIAGNOSIS — M1611 Unilateral primary osteoarthritis, right hip: Secondary | ICD-10-CM | POA: Diagnosis not present

## 2017-06-08 DIAGNOSIS — Z91040 Latex allergy status: Secondary | ICD-10-CM | POA: Diagnosis not present

## 2017-06-08 DIAGNOSIS — Z8249 Family history of ischemic heart disease and other diseases of the circulatory system: Secondary | ICD-10-CM | POA: Diagnosis not present

## 2017-06-08 DIAGNOSIS — E119 Type 2 diabetes mellitus without complications: Secondary | ICD-10-CM

## 2017-06-08 DIAGNOSIS — Z91010 Allergy to peanuts: Secondary | ICD-10-CM | POA: Diagnosis not present

## 2017-06-08 DIAGNOSIS — Z419 Encounter for procedure for purposes other than remedying health state, unspecified: Secondary | ICD-10-CM

## 2017-06-08 DIAGNOSIS — Z7984 Long term (current) use of oral hypoglycemic drugs: Secondary | ICD-10-CM

## 2017-06-08 DIAGNOSIS — Z882 Allergy status to sulfonamides status: Secondary | ICD-10-CM

## 2017-06-08 DIAGNOSIS — F17200 Nicotine dependence, unspecified, uncomplicated: Secondary | ICD-10-CM

## 2017-06-08 DIAGNOSIS — Z96642 Presence of left artificial hip joint: Secondary | ICD-10-CM | POA: Diagnosis present

## 2017-06-08 DIAGNOSIS — Z90710 Acquired absence of both cervix and uterus: Secondary | ICD-10-CM

## 2017-06-08 DIAGNOSIS — I10 Essential (primary) hypertension: Secondary | ICD-10-CM | POA: Diagnosis present

## 2017-06-08 DIAGNOSIS — Z96641 Presence of right artificial hip joint: Secondary | ICD-10-CM | POA: Diagnosis not present

## 2017-06-08 DIAGNOSIS — F17210 Nicotine dependence, cigarettes, uncomplicated: Secondary | ICD-10-CM | POA: Diagnosis not present

## 2017-06-08 DIAGNOSIS — Z881 Allergy status to other antibiotic agents status: Secondary | ICD-10-CM

## 2017-06-08 DIAGNOSIS — Z6841 Body Mass Index (BMI) 40.0 and over, adult: Secondary | ICD-10-CM | POA: Diagnosis not present

## 2017-06-08 DIAGNOSIS — Z471 Aftercare following joint replacement surgery: Secondary | ICD-10-CM | POA: Diagnosis not present

## 2017-06-08 HISTORY — PX: TOTAL HIP ARTHROPLASTY: SHX124

## 2017-06-08 LAB — GLUCOSE, CAPILLARY
GLUCOSE-CAPILLARY: 73 mg/dL (ref 65–99)
GLUCOSE-CAPILLARY: 148 mg/dL — AB (ref 65–99)
Glucose-Capillary: 101 mg/dL — ABNORMAL HIGH (ref 65–99)
Glucose-Capillary: 134 mg/dL — ABNORMAL HIGH (ref 65–99)
Glucose-Capillary: 230 mg/dL — ABNORMAL HIGH (ref 65–99)

## 2017-06-08 SURGERY — ARTHROPLASTY, HIP, TOTAL, ANTERIOR APPROACH
Anesthesia: Spinal | Site: Hip | Laterality: Right

## 2017-06-08 MED ORDER — SORBITOL 70 % SOLN: 30.0000 mL | Freq: Every day | Status: DC | PRN

## 2017-06-08 MED ORDER — DIPHENHYDRAMINE HCL 12.5 MG/5ML PO ELIX: 12.5000 mg | ORAL_SOLUTION | ORAL | Status: DC | PRN

## 2017-06-08 MED ORDER — DEXAMETHASONE SODIUM PHOSPHATE 10 MG/ML IJ SOLN
10.0000 mg | Freq: Once | INTRAMUSCULAR | Status: AC
  Administered 2017-06-09: 10 mg via INTRAVENOUS
  Filled 2017-06-08: qty 1

## 2017-06-08 MED ORDER — DEXMEDETOMIDINE HCL 200 MCG/2ML IV SOLN
INTRAVENOUS | Status: DC | PRN
  Administered 2017-06-08: 12 ug via INTRAVENOUS
  Administered 2017-06-08: 16 ug via INTRAVENOUS
  Administered 2017-06-08: 12 ug via INTRAVENOUS

## 2017-06-08 MED ORDER — ACETAMINOPHEN 160 MG/5ML PO SOLN: 325.0000 mg | ORAL | Status: DC | PRN

## 2017-06-08 MED ORDER — 0.9 % SODIUM CHLORIDE (POUR BTL) OPTIME
TOPICAL | Status: DC | PRN
  Administered 2017-06-08: 500 mL

## 2017-06-08 MED ORDER — KETOROLAC TROMETHAMINE 15 MG/ML IJ SOLN
15.0000 mg | Freq: Four times a day (QID) | INTRAMUSCULAR | Status: AC
  Administered 2017-06-08 (×2): 15 mg via INTRAVENOUS
  Filled 2017-06-08 (×2): qty 1

## 2017-06-08 MED ORDER — OXYCODONE HCL 5 MG PO TABS
5.0000 mg | ORAL_TABLET | Freq: Once | ORAL | Status: AC | PRN
  Administered 2017-06-08: 5 mg via ORAL

## 2017-06-08 MED ORDER — MIDAZOLAM HCL 2 MG/2ML IJ SOLN
INTRAMUSCULAR | Status: AC
  Filled 2017-06-08: qty 2

## 2017-06-08 MED ORDER — OXYCODONE HCL 5 MG PO TABS: 5.0000 mg | ORAL_TABLET | ORAL | 0 refills | Status: AC | PRN

## 2017-06-08 MED ORDER — METHOCARBAMOL 1000 MG/10ML IJ SOLN
500.0000 mg | Freq: Four times a day (QID) | INTRAVENOUS | Status: DC | PRN
  Filled 2017-06-08: qty 5

## 2017-06-08 MED ORDER — KETOROLAC TROMETHAMINE 30 MG/ML IJ SOLN
INTRAMUSCULAR | Status: AC
  Administered 2017-06-08: 15:00:00
  Filled 2017-06-08: qty 1

## 2017-06-08 MED ORDER — GLYBURIDE-METFORMIN 5-500 MG PO TABS: 1.0000 | ORAL_TABLET | Freq: Two times a day (BID) | ORAL | Status: DC

## 2017-06-08 MED ORDER — ACETAMINOPHEN 325 MG PO TABS: 325.0000 mg | ORAL_TABLET | ORAL | Status: DC | PRN

## 2017-06-08 MED ORDER — HYDROMORPHONE HCL 1 MG/ML IJ SOLN
INTRAMUSCULAR | Status: DC | PRN
  Administered 2017-06-08: 0.5 mg via INTRAVENOUS

## 2017-06-08 MED ORDER — ONDANSETRON HCL 4 MG PO TABS: 4.0000 mg | ORAL_TABLET | Freq: Three times a day (TID) | ORAL | 0 refills | Status: DC | PRN

## 2017-06-08 MED ORDER — ONDANSETRON HCL 4 MG/2ML IJ SOLN: 4.0000 mg | Freq: Once | INTRAMUSCULAR | Status: DC | PRN

## 2017-06-08 MED ORDER — LACTATED RINGERS IV SOLN
INTRAVENOUS | Status: DC
  Administered 2017-06-08: 16:00:00 via INTRAVENOUS

## 2017-06-08 MED ORDER — SUGAMMADEX SODIUM 500 MG/5ML IV SOLN
INTRAVENOUS | Status: DC | PRN
  Administered 2017-06-08: 300 mg via INTRAVENOUS

## 2017-06-08 MED ORDER — LOSARTAN POTASSIUM 50 MG PO TABS
50.0000 mg | ORAL_TABLET | Freq: Every day | ORAL | Status: DC
  Administered 2017-06-09: 50 mg via ORAL
  Filled 2017-06-08: qty 1

## 2017-06-08 MED ORDER — ONDANSETRON HCL 4 MG PO TABS: 4.0000 mg | ORAL_TABLET | Freq: Four times a day (QID) | ORAL | Status: DC | PRN

## 2017-06-08 MED ORDER — LACTATED RINGERS IV SOLN
INTRAVENOUS | Status: DC
  Administered 2017-06-08: 13:00:00 via INTRAVENOUS
  Administered 2017-06-08: 50 mL/h via INTRAVENOUS

## 2017-06-08 MED ORDER — DEXTROSE 50 % IV SOLN
INTRAVENOUS | Status: AC
  Filled 2017-06-08: qty 50

## 2017-06-08 MED ORDER — METHOCARBAMOL 500 MG PO TABS
ORAL_TABLET | ORAL | Status: AC
  Administered 2017-06-08: 15:00:00
  Filled 2017-06-08: qty 1

## 2017-06-08 MED ORDER — CEFAZOLIN SODIUM-DEXTROSE 2-4 GM/100ML-% IV SOLN
2.0000 g | Freq: Four times a day (QID) | INTRAVENOUS | Status: AC
  Administered 2017-06-08 (×2): 2 g via INTRAVENOUS
  Filled 2017-06-08 (×2): qty 100

## 2017-06-08 MED ORDER — IBUPROFEN 800 MG PO TABS: 800.0000 mg | ORAL_TABLET | Freq: Three times a day (TID) | ORAL | 2 refills | Status: AC | PRN

## 2017-06-08 MED ORDER — GLYBURIDE 5 MG PO TABS
5.0000 mg | ORAL_TABLET | Freq: Two times a day (BID) | ORAL | Status: DC
  Administered 2017-06-08 – 2017-06-09 (×2): 5 mg via ORAL
  Filled 2017-06-08 (×3): qty 1

## 2017-06-08 MED ORDER — ONDANSETRON HCL 4 MG/2ML IJ SOLN: 4.0000 mg | Freq: Four times a day (QID) | INTRAMUSCULAR | Status: DC | PRN

## 2017-06-08 MED ORDER — POLYETHYLENE GLYCOL 3350 17 G PO PACK: 17.0000 g | PACK | Freq: Every day | ORAL | Status: DC | PRN

## 2017-06-08 MED ORDER — OXYCODONE HCL 5 MG/5ML PO SOLN: 5.0000 mg | Freq: Once | ORAL | Status: AC | PRN

## 2017-06-08 MED ORDER — ACETAMINOPHEN 500 MG PO TABS
1000.0000 mg | ORAL_TABLET | Freq: Four times a day (QID) | ORAL | Status: AC
  Administered 2017-06-08 – 2017-06-09 (×4): 1000 mg via ORAL
  Filled 2017-06-08 (×4): qty 2

## 2017-06-08 MED ORDER — ASPIRIN EC 325 MG PO TBEC
325.0000 mg | DELAYED_RELEASE_TABLET | Freq: Every day | ORAL | Status: DC
  Administered 2017-06-09: 325 mg via ORAL
  Filled 2017-06-08: qty 1

## 2017-06-08 MED ORDER — TRANEXAMIC ACID 1000 MG/10ML IV SOLN
INTRAVENOUS | Status: AC | PRN
  Administered 2017-06-08: 2000 mg via TOPICAL

## 2017-06-08 MED ORDER — OXYCODONE HCL 5 MG PO TABS
ORAL_TABLET | ORAL | Status: AC
  Administered 2017-06-08: 15:00:00
  Filled 2017-06-08: qty 1

## 2017-06-08 MED ORDER — METOCLOPRAMIDE HCL 5 MG/ML IJ SOLN: 5.0000 mg | Freq: Three times a day (TID) | INTRAMUSCULAR | Status: DC | PRN

## 2017-06-08 MED ORDER — GABAPENTIN 300 MG PO CAPS
ORAL_CAPSULE | ORAL | Status: AC
  Administered 2017-06-08: 10:00:00
  Filled 2017-06-08: qty 1

## 2017-06-08 MED ORDER — METHOCARBAMOL 500 MG PO TABS
500.0000 mg | ORAL_TABLET | Freq: Four times a day (QID) | ORAL | Status: DC | PRN
  Administered 2017-06-08 – 2017-06-09 (×3): 500 mg via ORAL
  Filled 2017-06-08 (×3): qty 1

## 2017-06-08 MED ORDER — CHLORHEXIDINE GLUCONATE 4 % EX LIQD: 60.0000 mL | Freq: Once | CUTANEOUS | Status: DC

## 2017-06-08 MED ORDER — LOSARTAN POTASSIUM-HCTZ 50-12.5 MG PO TABS: 1.0000 | ORAL_TABLET | Freq: Every day | ORAL | Status: DC

## 2017-06-08 MED ORDER — METOCLOPRAMIDE HCL 5 MG PO TABS: 5.0000 mg | ORAL_TABLET | Freq: Three times a day (TID) | ORAL | Status: DC | PRN

## 2017-06-08 MED ORDER — PROPOFOL 10 MG/ML IV BOLUS
INTRAVENOUS | Status: AC
  Filled 2017-06-08: qty 20

## 2017-06-08 MED ORDER — HYDROCHLOROTHIAZIDE 12.5 MG PO CAPS
12.5000 mg | ORAL_CAPSULE | Freq: Every day | ORAL | Status: DC
  Administered 2017-06-09: 12.5 mg via ORAL
  Filled 2017-06-08: qty 1

## 2017-06-08 MED ORDER — MAGNESIUM CITRATE PO SOLN: 1.0000 | Freq: Once | ORAL | Status: DC | PRN

## 2017-06-08 MED ORDER — DEXTROSE 50 % IV SOLN
INTRAVENOUS | Status: DC | PRN
  Administered 2017-06-08: .25 via INTRAVENOUS

## 2017-06-08 MED ORDER — LACTATED RINGERS IV SOLN: INTRAVENOUS | Status: DC

## 2017-06-08 MED ORDER — ACETAMINOPHEN 500 MG PO TABS: 1000.0000 mg | ORAL_TABLET | Freq: Three times a day (TID) | ORAL | 0 refills | Status: AC

## 2017-06-08 MED ORDER — ACETAMINOPHEN 500 MG PO TABS
1000.0000 mg | ORAL_TABLET | Freq: Once | ORAL | Status: AC
  Administered 2017-06-08: 1000 mg via ORAL

## 2017-06-08 MED ORDER — FENTANYL CITRATE (PF) 100 MCG/2ML IJ SOLN
25.0000 ug | INTRAMUSCULAR | Status: DC | PRN
  Administered 2017-06-08 (×3): 25 ug via INTRAVENOUS

## 2017-06-08 MED ORDER — FENTANYL CITRATE (PF) 250 MCG/5ML IJ SOLN
INTRAMUSCULAR | Status: AC
  Filled 2017-06-08: qty 5

## 2017-06-08 MED ORDER — METHOCARBAMOL 500 MG PO TABS: 500.0000 mg | ORAL_TABLET | Freq: Four times a day (QID) | ORAL | 0 refills | Status: DC | PRN

## 2017-06-08 MED ORDER — DOCUSATE SODIUM 100 MG PO CAPS: 100.0000 mg | ORAL_CAPSULE | Freq: Two times a day (BID) | ORAL | 0 refills | Status: DC

## 2017-06-08 MED ORDER — LIDOCAINE HCL (CARDIAC) 20 MG/ML IV SOLN
INTRAVENOUS | Status: DC | PRN
  Administered 2017-06-08: 40 mg via INTRAVENOUS
  Administered 2017-06-08: 60 mg via INTRAVENOUS

## 2017-06-08 MED ORDER — ACETAMINOPHEN 325 MG PO TABS: 325.0000 mg | ORAL_TABLET | Freq: Four times a day (QID) | ORAL | Status: DC | PRN

## 2017-06-08 MED ORDER — GABAPENTIN 300 MG PO CAPS
300.0000 mg | ORAL_CAPSULE | Freq: Once | ORAL | Status: AC
  Administered 2017-06-08: 300 mg via ORAL

## 2017-06-08 MED ORDER — CEFAZOLIN SODIUM-DEXTROSE 2-4 GM/100ML-% IV SOLN
INTRAVENOUS | Status: AC
  Filled 2017-06-08: qty 100

## 2017-06-08 MED ORDER — ONDANSETRON HCL 4 MG/2ML IJ SOLN
INTRAMUSCULAR | Status: DC | PRN
  Administered 2017-06-08: 4 mg via INTRAVENOUS

## 2017-06-08 MED ORDER — ROCURONIUM BROMIDE 100 MG/10ML IV SOLN
INTRAVENOUS | Status: DC | PRN
  Administered 2017-06-08: 10 mg via INTRAVENOUS
  Administered 2017-06-08: 20 mg via INTRAVENOUS
  Administered 2017-06-08: 50 mg via INTRAVENOUS

## 2017-06-08 MED ORDER — SODIUM CHLORIDE FLUSH 0.9 % IV SOLN
INTRAVENOUS | Status: DC | PRN
  Administered 2017-06-08: 20 mL

## 2017-06-08 MED ORDER — PHENYLEPHRINE HCL 10 MG/ML IJ SOLN
INTRAMUSCULAR | Status: DC | PRN
  Administered 2017-06-08 (×3): 80 ug via INTRAVENOUS

## 2017-06-08 MED ORDER — INSULIN ASPART 100 UNIT/ML ~~LOC~~ SOLN
0.0000 [IU] | Freq: Three times a day (TID) | SUBCUTANEOUS | Status: DC
  Administered 2017-06-09 (×2): 1 [IU] via SUBCUTANEOUS

## 2017-06-08 MED ORDER — DEXMEDETOMIDINE HCL IN NACL 200 MCG/50ML IV SOLN
INTRAVENOUS | Status: AC
  Filled 2017-06-08: qty 50

## 2017-06-08 MED ORDER — OMEPRAZOLE 20 MG PO CPDR: 20.0000 mg | DELAYED_RELEASE_CAPSULE | Freq: Every day | ORAL | 0 refills | Status: DC

## 2017-06-08 MED ORDER — SUGAMMADEX SODIUM 200 MG/2ML IV SOLN
INTRAVENOUS | Status: AC
  Filled 2017-06-08: qty 2

## 2017-06-08 MED ORDER — BUPIVACAINE HCL (PF) 0.5 % IJ SOLN
INTRAMUSCULAR | Status: AC
  Filled 2017-06-08: qty 10

## 2017-06-08 MED ORDER — MEPERIDINE HCL 50 MG/ML IJ SOLN: 6.2500 mg | INTRAMUSCULAR | Status: DC | PRN

## 2017-06-08 MED ORDER — ONDANSETRON HCL 4 MG/2ML IJ SOLN
INTRAMUSCULAR | Status: AC
  Filled 2017-06-08: qty 4

## 2017-06-08 MED ORDER — BUPIVACAINE LIPOSOME 1.3 % IJ SUSP
20.0000 mL | Freq: Once | INTRAMUSCULAR | Status: AC
  Administered 2017-06-08: 20 mL
  Filled 2017-06-08: qty 20

## 2017-06-08 MED ORDER — ACETAMINOPHEN 500 MG PO TABS
ORAL_TABLET | ORAL | Status: AC
  Administered 2017-06-08: 10:00:00
  Filled 2017-06-08: qty 2

## 2017-06-08 MED ORDER — DEXAMETHASONE SODIUM PHOSPHATE 10 MG/ML IJ SOLN
INTRAMUSCULAR | Status: AC
  Filled 2017-06-08: qty 2

## 2017-06-08 MED ORDER — PHENOL 1.4 % MT LIQD: 1.0000 | OROMUCOSAL | Status: DC | PRN

## 2017-06-08 MED ORDER — FENTANYL CITRATE (PF) 100 MCG/2ML IJ SOLN
INTRAMUSCULAR | Status: AC
  Administered 2017-06-08: 15:00:00
  Filled 2017-06-08: qty 2

## 2017-06-08 MED ORDER — KETOROLAC TROMETHAMINE 30 MG/ML IJ SOLN
30.0000 mg | Freq: Once | INTRAMUSCULAR | Status: DC | PRN
  Administered 2017-06-08: 30 mg via INTRAVENOUS

## 2017-06-08 MED ORDER — METFORMIN HCL 500 MG PO TABS
500.0000 mg | ORAL_TABLET | Freq: Two times a day (BID) | ORAL | Status: DC
  Administered 2017-06-08 – 2017-06-09 (×2): 500 mg via ORAL
  Filled 2017-06-08 (×2): qty 1

## 2017-06-08 MED ORDER — PANTOPRAZOLE SODIUM 40 MG PO TBEC
40.0000 mg | DELAYED_RELEASE_TABLET | Freq: Every day | ORAL | Status: DC
  Administered 2017-06-08 – 2017-06-09 (×2): 40 mg via ORAL
  Filled 2017-06-08 (×2): qty 1

## 2017-06-08 MED ORDER — HYDROMORPHONE HCL 1 MG/ML IJ SOLN
INTRAMUSCULAR | Status: AC
  Filled 2017-06-08: qty 0.5

## 2017-06-08 MED ORDER — OXYCODONE HCL 5 MG PO TABS
5.0000 mg | ORAL_TABLET | ORAL | Status: DC | PRN
  Administered 2017-06-08 – 2017-06-09 (×3): 10 mg via ORAL
  Filled 2017-06-08 (×3): qty 2

## 2017-06-08 MED ORDER — PROPOFOL 10 MG/ML IV BOLUS
INTRAVENOUS | Status: DC | PRN
  Administered 2017-06-08: 200 mg via INTRAVENOUS

## 2017-06-08 MED ORDER — PHENYLEPHRINE 40 MCG/ML (10ML) SYRINGE FOR IV PUSH (FOR BLOOD PRESSURE SUPPORT)
PREFILLED_SYRINGE | INTRAVENOUS | Status: AC
  Filled 2017-06-08: qty 20

## 2017-06-08 MED ORDER — HYDROMORPHONE HCL 1 MG/ML IJ SOLN: 0.5000 mg | INTRAMUSCULAR | Status: DC | PRN

## 2017-06-08 MED ORDER — LIDOCAINE HCL (CARDIAC) 20 MG/ML IV SOLN
INTRAVENOUS | Status: AC
  Filled 2017-06-08: qty 10

## 2017-06-08 MED ORDER — ROCURONIUM BROMIDE 10 MG/ML (PF) SYRINGE
PREFILLED_SYRINGE | INTRAVENOUS | Status: AC
  Filled 2017-06-08: qty 10

## 2017-06-08 MED ORDER — POVIDONE-IODINE 10 % EX SWAB: 2.0000 "application " | Freq: Once | CUTANEOUS | Status: DC

## 2017-06-08 MED ORDER — ASPIRIN EC 325 MG PO TBEC: 325.0000 mg | DELAYED_RELEASE_TABLET | Freq: Every day | ORAL | 0 refills | Status: AC

## 2017-06-08 MED ORDER — MENTHOL 3 MG MT LOZG: 1.0000 | LOZENGE | OROMUCOSAL | Status: DC | PRN

## 2017-06-08 MED ORDER — MIDAZOLAM HCL 5 MG/5ML IJ SOLN
INTRAMUSCULAR | Status: DC | PRN
  Administered 2017-06-08: 2 mg via INTRAVENOUS

## 2017-06-08 MED ORDER — INSULIN ASPART 100 UNIT/ML ~~LOC~~ SOLN
0.0000 [IU] | Freq: Every day | SUBCUTANEOUS | Status: DC
  Administered 2017-06-08: 2 [IU] via SUBCUTANEOUS

## 2017-06-08 MED ORDER — CITALOPRAM HYDROBROMIDE 10 MG PO TABS
10.0000 mg | ORAL_TABLET | Freq: Every day | ORAL | Status: DC
  Administered 2017-06-09: 10 mg via ORAL
  Filled 2017-06-08: qty 1

## 2017-06-08 MED ORDER — FENTANYL CITRATE (PF) 100 MCG/2ML IJ SOLN
INTRAMUSCULAR | Status: DC | PRN
  Administered 2017-06-08 (×3): 100 ug via INTRAVENOUS
  Administered 2017-06-08: 50 ug via INTRAVENOUS

## 2017-06-08 MED ORDER — DEXAMETHASONE SODIUM PHOSPHATE 10 MG/ML IJ SOLN
INTRAMUSCULAR | Status: DC | PRN
  Administered 2017-06-08: 10 mg via INTRAVENOUS

## 2017-06-08 MED ORDER — DOCUSATE SODIUM 100 MG PO CAPS
100.0000 mg | ORAL_CAPSULE | Freq: Two times a day (BID) | ORAL | Status: DC
  Administered 2017-06-08 – 2017-06-09 (×2): 100 mg via ORAL
  Filled 2017-06-08 (×2): qty 1

## 2017-06-08 MED ORDER — CEFAZOLIN SODIUM-DEXTROSE 2-4 GM/100ML-% IV SOLN
2.0000 g | INTRAVENOUS | Status: AC
  Administered 2017-06-08: 2 g via INTRAVENOUS

## 2017-06-08 MED ORDER — BUPIVACAINE HCL (PF) 0.25 % IJ SOLN
INTRAMUSCULAR | Status: AC
  Filled 2017-06-08: qty 30

## 2017-06-08 SURGICAL SUPPLY — 58 items
BAG DECANTER FOR FLEXI CONT (MISCELLANEOUS) ×3 IMPLANT
BLADE SAG 18X100X1.27 (BLADE) ×3 IMPLANT
CLOSURE STERI-STRIP 1/2X4 (GAUZE/BANDAGES/DRESSINGS) ×1
CLOSURE WOUND 1/2 X4 (GAUZE/BANDAGES/DRESSINGS) ×1
CLSR STERI-STRIP ANTIMIC 1/2X4 (GAUZE/BANDAGES/DRESSINGS) ×2 IMPLANT
COVER PERINEAL POST (MISCELLANEOUS) ×3 IMPLANT
COVER SURGICAL LIGHT HANDLE (MISCELLANEOUS) ×3 IMPLANT
DRAPE C-ARM 42X72 X-RAY (DRAPES) ×3 IMPLANT
DRAPE STERI IOBAN 125X83 (DRAPES) ×3 IMPLANT
DRAPE U-SHAPE 47X51 STRL (DRAPES) ×3 IMPLANT
DRAPE UNIVERSAL PACK (DRAPES) ×3 IMPLANT
DRSG MEPILEX BORDER 4X8 (GAUZE/BANDAGES/DRESSINGS) ×3 IMPLANT
DURAPREP 26ML APPLICATOR (WOUND CARE) ×3 IMPLANT
ELECT BLADE 4.0 EZ CLEAN MEGAD (MISCELLANEOUS) ×3
ELECT REM PT RETURN 9FT ADLT (ELECTROSURGICAL) ×3
ELECTRODE BLDE 4.0 EZ CLN MEGD (MISCELLANEOUS) ×1 IMPLANT
ELECTRODE REM PT RTRN 9FT ADLT (ELECTROSURGICAL) ×1 IMPLANT
FACESHIELD WRAPAROUND (MASK) ×6 IMPLANT
GLOVE BIO SURGEON STRL SZ7.5 (GLOVE) ×6 IMPLANT
GLOVE BIOGEL PI IND STRL 8 (GLOVE) ×2 IMPLANT
GLOVE BIOGEL PI INDICATOR 8 (GLOVE) ×4
GOWN STRL REUS W/ TWL LRG LVL3 (GOWN DISPOSABLE) ×2 IMPLANT
GOWN STRL REUS W/TWL LRG LVL3 (GOWN DISPOSABLE) ×6
HEAD BIOLOX HIP 36/-2.5 (Joint) ×1 IMPLANT
HIP BIOLOX HD 36/-2.5 (Joint) ×3 IMPLANT
INSERT TRIDENT POLY 36MM 0DEG (Insert) ×3 IMPLANT
KIT BASIN OR (CUSTOM PROCEDURE TRAY) ×3 IMPLANT
KIT ROOM TURNOVER OR (KITS) ×3 IMPLANT
MANIFOLD NEPTUNE II (INSTRUMENTS) ×3 IMPLANT
NDL SAFETY ECLIPSE 18X1.5 (NEEDLE) IMPLANT
NEEDLE HYPO 18GX1.5 SHARP (NEEDLE)
NEEDLE HYPO 22GX1.5 SAFETY (NEEDLE) ×3 IMPLANT
NS IRRIG 1000ML POUR BTL (IV SOLUTION) ×3 IMPLANT
PACK TOTAL JOINT (CUSTOM PROCEDURE TRAY) ×3 IMPLANT
PAD ARMBOARD 7.5X6 YLW CONV (MISCELLANEOUS) ×3 IMPLANT
SCREW HEX LP 6.5X20 (Screw) ×3 IMPLANT
SHELL CLUSTERHOLE ACETABULAR 5 (Shell) ×3 IMPLANT
SPONGE LAP 18X18 X RAY DECT (DISPOSABLE) IMPLANT
STEM HIP 4 127DEG (Stem) ×3 IMPLANT
STRIP CLOSURE SKIN 1/2X4 (GAUZE/BANDAGES/DRESSINGS) ×2 IMPLANT
SUT MNCRL AB 4-0 PS2 18 (SUTURE) ×3 IMPLANT
SUT MON AB 2-0 CT1 36 (SUTURE) ×3 IMPLANT
SUT VIC AB 0 CT1 27 (SUTURE) ×3
SUT VIC AB 0 CT1 27XBRD ANBCTR (SUTURE) ×1 IMPLANT
SUT VIC AB 1 CT1 27 (SUTURE) ×9
SUT VIC AB 1 CT1 27XBRD ANBCTR (SUTURE) ×3 IMPLANT
SUT VIC AB 2-0 CT1 27 (SUTURE) ×6
SUT VIC AB 2-0 CT1 TAPERPNT 27 (SUTURE) ×2 IMPLANT
SUT VLOC 180 0 24IN GS25 (SUTURE) ×3 IMPLANT
SYR 50ML LL SCALE MARK (SYRINGE) ×3 IMPLANT
SYR BULB IRRIGATION 50ML (SYRINGE) ×3 IMPLANT
SYRINGE 20CC LL (MISCELLANEOUS) IMPLANT
TOWEL OR 17X24 6PK STRL BLUE (TOWEL DISPOSABLE) ×3 IMPLANT
TOWEL OR 17X26 10 PK STRL BLUE (TOWEL DISPOSABLE) ×3 IMPLANT
TRAY CATH 16FR W/PLASTIC CATH (SET/KITS/TRAYS/PACK) IMPLANT
TRAY FOLEY W/METER SILVER 16FR (SET/KITS/TRAYS/PACK) IMPLANT
WATER STERILE IRR 1000ML POUR (IV SOLUTION) ×3 IMPLANT
YANKAUER SUCT BULB TIP NO VENT (SUCTIONS) ×6 IMPLANT

## 2017-06-08 NOTE — Progress Notes (Signed)
Notified Dr. Tacy Duraddono of CBG 73, patient asymptomatic.  No new orders given at this time.

## 2017-06-08 NOTE — Op Note (Signed)
06/08/2017  1:36 PM  PATIENT:  Lisa [name]   MRN: [id number]  PRE-OPERATIVE DIAGNOSIS:  OA RIGHT HIP  POST-OPERATIVE DIAGNOSIS:  OA RIGHT HIP  PROCEDURE:  Procedure(s): TOTAL HIP ARTHROPLASTY ANTERIOR APPROACH  PREOPERATIVE INDICATIONS:    Lisa [name] is an 47 y.o. female who has a diagnosis of Primary osteoarthritis of right hip and elected for surgical management after failing conservative treatment.  The risks benefits and alternatives were discussed with the patient including but not limited to the risks of nonoperative treatment, versus surgical intervention including infection, bleeding, nerve injury, periprosthetic fracture, the need for revision surgery, dislocation, leg length discrepancy, blood clots, cardiopulmonary complications, morbidity, mortality, among others, and they were willing to proceed.     OPERATIVE REPORT     SURGEON:   Latiesha Harada, Ernesta Amble, MD    ASSISTANT:  Roxan Hockey, PA-C, he was present and scrubbed throughout the case, critical for completion in a timely fashion, and for retraction, instrumentation, and closure.     ANESTHESIA:  General    COMPLICATIONS:  None.     COMPONENTS:  Stryker acolade fit femur size 4 with a 36 mm -2.5 head ball and a PSL acetabular shell size 52 with a  polyethylene liner    PROCEDURE IN DETAIL:   The patient was met in the holding area and  identified.  The appropriate hip was identified and marked at the operative site.  The patient was then transported to the OR  and  placed under anesthesia per that record.  At that point, the patient was  placed in the supine position and  secured to the operating room table and all bony prominences padded. He received pre-operative antibiotics    The operative lower extremity was prepped from the iliac crest to the distal leg.  Sterile draping was performed.  Time out was performed prior to incision.      Skin incision was made just 2 cm lateral to the ASIS  extending in  line with the tensor fascia lata. Electrocautery was used to control all bleeders. I dissected down sharply to the fascia of the tensor fascia lata was confirmed that the muscle fibers beneath were running posteriorly. I then incised the fascia over the superficial tensor fascia lata in line with the incision. The fascia was elevated off the anterior aspect of the muscle the muscle was retracted posteriorly and protected throughout the case. I then used electrocautery to incise the tensor fascia lata fascia control and all bleeders. Immediately visible was the fat over top of the anterior neck and capsule.  I removed the anterior fat from the capsule and elevated the rectus muscle off of the anterior capsule. I then removed a large time of capsule. The retractors were then placed over the anterior acetabulum as well as around the superior and inferior neck.  I then removed a section of the femoral neck and a napkin ring fashion. Then used the power course to remove the femoral head from the acetabulum and thoroughly irrigated the acetabulum. I sized the femoral head.    I then exposed the deep acetabulum, cleared out any tissue including the ligamentum teres.   After adequate visualization, I excised the labrum, and then sequentially reamed.  I then impacted the acetabular implant into place using fluoroscopy for guidance.  Appropriate version and inclination was confirmed clinically matching their bony anatomy, and with fluoroscopy.  I placed a 20 mm screw in the posterior/superio position with an excellent bite.  I then placed the polyethylene liner in place  I then adducted the leg and released the external rotators from the posterior femur allowing it to be easily delivered up lateral and anterior to the acetabulum for preparation of the femoral canal.    I then prepared the proximal femur using the cookie-cutter and then sequentially reamed and broached.  A trial broach, neck, and head was  utilized, and I reduced the hip and used floroscopy to assess the neck length and femoral implant.  I then impacted the femoral prosthesis into place into the appropriate version. The hip was then reduced and fluoroscopy confirmed appropriate position. Leg lengths were restored.  I then irrigated the hip copiously again with, and repaired the fascia with Vicryl, followed by monocryl for the subcutaneous tissue, Monocryl for the skin, Steri-Strips and sterile gauze. The patient was then awakened and returned to PACU in stable and satisfactory condition. There were no complications.  POST OPERATIVE PLAN: WBAT, DVT px: SCD's/TED, ambulation and chemical dvt px  Piers Baade, MD Orthopedic Surgeon [phone]     

## 2017-06-08 NOTE — Anesthesia Preprocedure Evaluation (Signed)
Anesthesia Evaluation  Patient identified by MRN, date of birth, ID band Patient awake    Reviewed: Allergy & Precautions, H&P , NPO status , Patient's Chart, lab work & pertinent test results  Airway Mallampati: II  TM Distance: >3 FB Neck ROM: Full    Dental no notable dental hx.    Pulmonary Current Smoker,    Pulmonary exam normal breath sounds clear to auscultation       Cardiovascular hypertension, Pt. on medications Normal cardiovascular exam Rhythm:Regular Rate:Normal     Neuro/Psych negative neurological ROS  negative psych ROS   GI/Hepatic negative GI ROS, Neg liver ROS,   Endo/Other  diabetes, Type obesity  Renal/GU negative Renal ROS     Musculoskeletal negative musculoskeletal ROS (+)   Abdominal   Peds  Hematology negative hematology ROS (+)   Anesthesia Other Findings   Reproductive/Obstetrics negative OB ROS                             Anesthesia Physical  Anesthesia Plan  ASA: III  Anesthesia Plan: Spinal   Post-op Pain Management:    Induction:   PONV Risk Score and Plan:   Airway Management Planned: Nasal Cannula and Natural Airway  Additional Equipment:   Intra-op Plan:   Post-operative Plan:   Informed Consent: I have reviewed the patients History and Physical, chart, labs and discussed the procedure including the risks, benefits and alternatives for the proposed anesthesia with the patient or authorized representative who has indicated his/her understanding and acceptance.   Dental advisory given  Plan Discussed with: CRNA, Anesthesiologist and Surgeon  Anesthesia Plan Comments:         Anesthesia Quick Evaluation

## 2017-06-08 NOTE — Anesthesia Procedure Notes (Signed)
Spinal  Patient location during procedure: OR Start time: 06/08/2017 12:00 PM End time: 06/08/2017 12:05 PM Staffing Anesthesiologist: Bethena Midgetddono, Melicia Esqueda, MD Preanesthetic Checklist Completed: patient identified, site marked, surgical consent, pre-op evaluation, timeout performed, IV checked, risks and benefits discussed and monitors and equipment checked Spinal Block Patient position: sitting Prep: DuraPrep Patient monitoring: heart rate, cardiac monitor, continuous pulse ox and blood pressure Approach: midline Location: L4-5 Injection technique: single-shot Needle Needle type: Sprotte  Needle gauge: 24 G Needle length: 9 cm Assessment Sensory level: T4 Events: failed spinal Additional Notes Multiple attempts Osso...Marland Kitchen..Marland Kitchen

## 2017-06-08 NOTE — Transfer of Care (Signed)
Immediate Anesthesia Transfer of Care Note  Patient: Lisa [name]  Procedure(s) Performed: TOTAL HIP ARTHROPLASTY ANTERIOR APPROACH (Right Hip)  Patient Location: PACU  Anesthesia Type:General  Level of Consciousness: awake, alert  and oriented  Airway & Oxygen Therapy: Patient Spontanous Breathing and Patient connected to nasal cannula oxygen  Post-op Assessment: Report given to RN, Post -op Vital signs reviewed and stable and Patient moving all extremities X 4  Post vital signs: Reviewed and stable  Last Vitals:  Vitals Value Taken Time  BP 133/85 06/08/2017  2:32 PM  Temp    Pulse 72 06/08/2017  2:40 PM  Resp 18 06/08/2017  2:40 PM  SpO2 100 % 06/08/2017  2:40 PM  Vitals shown include unvalidated device data.  Last Pain:  Vitals:   06/08/17 0929  TempSrc:   PainSc: 10-Worst pain ever      Patients Stated Pain Goal: 3 (06/08/17 0929)  Complications: No apparent anesthesia complications

## 2017-06-08 NOTE — Interval H&P Note (Signed)
History and Physical Interval Note:  06/08/2017 10:55 AM  Lisa [name]  has presented today for surgery, with the diagnosis of OA RIGHT HIP  The various methods of treatment have been discussed with the patient and family. After consideration of risks, benefits and other options for treatment, the patient has consented to  Procedure(s): TOTAL HIP ARTHROPLASTY ANTERIOR APPROACH (Right) as a surgical intervention .  The patient's history has been reviewed, patient examined, no change in status, stable for surgery.  I have reviewed the patient's chart and labs.  Questions were answered to the patient's satisfaction.     Orval Dortch D

## 2017-06-08 NOTE — Discharge Instructions (Signed)
You may bear weight as tolerated. °Keep your dressing on and dry until follow up. °Take medicine to prevent blood clots as directed. °Take pain medicine as needed with the goal of transitioning to over the counter medicines.  °If needed, you may increase pain medication for the first few days post op to 2 tablets every 4 hours. ° °INSTRUCTIONS AFTER JOINT REPLACEMENT  ° °o Remove items at home which could result in a fall. This includes throw rugs or furniture in walking pathways °o ICE to the affected joint every three hours while awake for 30 minutes at a time, for at least the first 3-5 days, and then as needed for pain and swelling.  Continue to use ice for pain and swelling. You may notice swelling that will progress down to the foot and ankle.  This is normal after surgery.  Elevate your leg when you are not up walking on it.   °o Continue to use the breathing machine you got in the hospital (incentive spirometer) which will help keep your temperature down.  It is common for your temperature to cycle up and down following surgery, especially at night when you are not up moving around and exerting yourself.  The breathing machine keeps your lungs expanded and your temperature down. ° ° °DIET:  As you were doing prior to hospitalization, we recommend a well-balanced diet. ° °DRESSING / WOUND CARE / SHOWERING ° °You may shower 3 days after surgery, but keep the wounds dry during showering.  You may use an occlusive plastic wrap (Press'n Seal for example) with blue painter's tape at edges, NO SOAKING/SUBMERGING IN THE BATHTUB.  If the bandage gets wet, change with a clean dry gauze.  If the incision gets wet, pat the wound dry with a clean towel. ° °ACTIVITY ° °o Increase activity slowly as tolerated, but follow the weight bearing instructions below.   °o No driving for 6 weeks or until further direction given by your physician.  You cannot drive while taking narcotics.  °o No lifting or carrying greater than 10  lbs. until further directed by your surgeon. °o Avoid periods of inactivity such as sitting longer than an hour when not asleep. This helps prevent blood clots.  °o You may return to work once you are authorized by your doctor.  ° ° ° °WEIGHT BEARING  ° °Weight bearing as tolerated with assist device (walker, cane, etc) as directed, use it as long as suggested by your surgeon or therapist, typically at least 4-6 weeks. ° ° °EXERCISES ° °Results after joint replacement surgery are often greatly improved when you follow the exercise, range of motion and muscle strengthening exercises prescribed by your doctor. Safety measures are also important to protect the joint from further injury. Any time any of these exercises cause you to have increased pain or swelling, decrease what you are doing until you are comfortable again and then slowly increase them. If you have problems or questions, call your caregiver or physical therapist for advice.  ° °Rehabilitation is important following a joint replacement. After just a few days of immobilization, the muscles of the leg can become weakened and shrink (atrophy).  These exercises are designed to build up the tone and strength of the thigh and leg muscles and to improve motion. Often times heat used for twenty to thirty minutes before working out will loosen up your tissues and help with improving the range of motion but do not use heat for the first two   weeks following surgery (sometimes heat can increase post-operative swelling).  ° °These exercises can be done on a training (exercise) mat, on the floor, on a table or on a bed. Use whatever works the best and is most comfortable for you.    Use music or television while you are exercising so that the exercises are a pleasant break in your day. This will make your life better with the exercises acting as a break in your routine that you can look forward to.   Perform all exercises about fifteen times, three times per day or as  directed.  You should exercise both the operative leg and the other leg as well. ° °Exercises include: °  °• Quad Sets - Tighten up the muscle on the front of the thigh (Quad) and hold for 5-10 seconds.   °• Straight Leg Raises - With your knee straight (if you were given a brace, keep it on), lift the leg to 60 degrees, hold for 3 seconds, and slowly lower the leg.  Perform this exercise against resistance later as your leg gets stronger.  °• Leg Slides: Lying on your back, slowly slide your foot toward your buttocks, bending your knee up off the floor (only go as far as is comfortable). Then slowly slide your foot back down until your leg is flat on the floor again.  °• Angel Wings: Lying on your back spread your legs to the side as far apart as you can without causing discomfort.  °• Hamstring Strength:  Lying on your back, push your heel against the floor with your leg straight by tightening up the muscles of your buttocks.  Repeat, but this time bend your knee to a comfortable angle, and push your heel against the floor.  You may put a pillow under the heel to make it more comfortable if necessary.  ° °A rehabilitation program following joint replacement surgery can speed recovery and prevent re-injury in the future due to weakened muscles. Contact your doctor or a physical therapist for more information on knee rehabilitation.  ° ° °CONSTIPATION ° °Constipation is defined medically as fewer than three stools per week and severe constipation as less than one stool per week.  Even if you have a regular bowel pattern at home, your normal regimen is likely to be disrupted due to multiple reasons following surgery.  Combination of anesthesia, postoperative narcotics, change in appetite and fluid intake all can affect your bowels.  ° °YOU MUST use at least one of the following options; they are listed in order of increasing strength to get the job done.  They are all available over the counter, and you may need to  use some, POSSIBLY even all of these options:   ° °Drink plenty of fluids (prune juice may be helpful) and high fiber foods °Colace 100 mg by mouth twice a day  °Senokot for constipation as directed and as needed Dulcolax (bisacodyl), take with full glass of water  °Miralax (polyethylene glycol) once or twice a day as needed. ° °If you have tried all these things and are unable to have a bowel movement in the first 3-4 days after surgery call either your surgeon or your primary doctor.   ° °If you experience loose stools or diarrhea, hold the medications until you stool forms back up.  If your symptoms do not get better within 1 week or if they get worse, check with your doctor.  If you experience "the worst abdominal pain ever" or   develop nausea or vomiting, please contact the office immediately for further recommendations for treatment. ° ° °ITCHING:  If you experience itching with your medications, try taking only a single pain pill, or even half a pain pill at a time.  You can also use Benadryl over the counter for itching or also to help with sleep.  ° °TED HOSE STOCKINGS:  Use stockings on both legs until for at least 2 weeks or as directed by physician office. They may be removed at night for sleeping. ° °MEDICATIONS:  See your medication summary on the “After Visit Summary” that nursing will review with you.  You may have some home medications which will be placed on hold until you complete the course of blood thinner medication.  It is important for you to complete the blood thinner medication as prescribed. ° °PRECAUTIONS:  If you experience chest pain or shortness of breath - call 911 immediately for transfer to the hospital emergency department.  ° °If you develop a fever greater that 101 F, purulent drainage from wound, increased redness or drainage from wound, foul odor from the wound/dressing, or calf pain - CONTACT YOUR SURGEON.   °                                                °FOLLOW-UP  APPOINTMENTS:  If you do not already have a post-op appointment, please call the office for an appointment to be seen by your surgeon.  Guidelines for how soon to be seen are listed in your “After Visit Summary”, but are typically between 1-4 weeks after surgery. ° °OTHER INSTRUCTIONS:  ° ° ° °MAKE SURE YOU:  °• Understand these instructions.  °• Get help right away if you are not doing well or get worse.  ° ° °Thank you for letting us be a part of your medical care team.  It is a privilege we respect greatly.  We hope these instructions will help you stay on track for a fast and full recovery!  ° ° ° ° °

## 2017-06-09 LAB — GLUCOSE, CAPILLARY
GLUCOSE-CAPILLARY: 139 mg/dL — AB (ref 65–99)
Glucose-Capillary: 134 mg/dL — ABNORMAL HIGH (ref 65–99)

## 2017-06-09 NOTE — Progress Notes (Signed)
Discharge instructions (including medications) discussed with and copy provided to patient/caregiver, along with printed paper prescriptions for all new medications.

## 2017-06-09 NOTE — Progress Notes (Signed)
Occupational Therapy Evaluation Patient Details Name: Lisa [name] MRN: [id number] DOB: 12/09/1970 Today's Date: 06/09/2017    History of Present Illness 47 yo s/p RTHA - ant approach. PMH L THA;DM   Clinical Impression   PTA, pt lived with her family and was independent with mobility and required min A with LB ADL due to pain and body habitus. Pt making excellent progress. Mobilizing with S @ RW level. Began education on use of AE for LB ADL. Will benefit from an additional session of OT prior to DC to complete education on tub transfers and use of AE for LB ADL. Pt appreciative.     Follow Up Recommendations  No OT follow up;Supervision - Intermittent    Equipment Recommendations  3 in 1 bedside commode    Recommendations for Other Services       Precautions / Restrictions Precautions Precautions: None Restrictions RLE Weight Bearing: Weight bearing as tolerated      Mobility Bed Mobility Overal bed mobility: Needs Assistance Bed Mobility: Supine to Sit     Supine to sit: Supervision        Transfers Overall transfer level: Needs assistance Equipment used: Rolling walker (2 wheeled) Transfers: Sit to/from Stand Sit to Stand: Min guard         General transfer comment: vc for hand placement    Balance Overall balance assessment: No apparent balance deficits (not formally assessed)                                         ADL either performed or assessed with clinical judgement   ADL Overall ADL's : Needs assistance/impaired     Grooming: Modified independent;Standing   Upper Body Bathing: Set up;Standing   Lower Body Bathing: Minimal assistance;Sit to/from stand Lower Body Bathing Details (indicate cue type and reason): difficulty reacing feet Upper Body Dressing : Set up;Sitting   Lower Body Dressing: Minimal assistance;Sit to/from stand   Toilet Transfer: Supervision/safety;RW;Ambulation;BSC(over toilet)   Toileting-  Clothing Manipulation and Hygiene: Modified independent;Sit to/from stand       Functional mobility during ADLs: Supervision/safety;Rolling walker;Cueing for safety General ADL Comments: Pt had difficulty reaching feet and completing LB prior to surgery. Began education on use of AE. Pt asking about tub transfers     Vision         Perception     Praxis      Pertinent Vitals/Pain Pain Assessment: 0-10 Pain Score: 8  Pain Location: R hip Pain Descriptors / Indicators: Aching;Discomfort Pain Intervention(s): Limited activity within patient's tolerance;Repositioned;Ice applied;Patient requesting pain meds-RN notified;RN gave pain meds during session     Hand Dominance Right   Extremity/Trunk Assessment Upper Extremity Assessment Upper Extremity Assessment: Overall WFL for tasks assessed   Lower Extremity Assessment Lower Extremity Assessment: Defer to PT evaluation   Cervical / Trunk Assessment Cervical / Trunk Assessment: Normal   Communication Communication Communication: No difficulties   Cognition Arousal/Alertness: Awake/alert Behavior During Therapy: WFL for tasks assessed/performed Overall Cognitive Status: Within Functional Limits for tasks assessed                                     General Comments       Exercises     Shoulder Instructions      Home Living Family/patient expects to be  discharged to:: Private residence Living Arrangements: Spouse/significant other Available Help at Discharge: Available 24 hours/day Type of Home: House Home Access: Stairs to enter [employer] of Steps: 4 Entrance Stairs-Rails: Right Home Layout: One level     Bathroom Shower/Tub: IT trainer: Standard Bathroom Accessibility: Yes How Accessible: Accessible via walker Home Equipment: None          Prior Functioning/Environment Level of Independence: Independent        Comments: works in  Airline pilot Problem List: Decreased strength;Decreased range of motion;Decreased knowledge of use of DME or AE;Obesity;Pain      OT Treatment/Interventions: Self-care/ADL training;DME and/or AE instruction;Therapeutic activities;Patient/family education    OT Goals(Current goals can be found in the care plan section) Acute Rehab OT Goals Patient Stated Goal: to go home OT Goal Formulation: With patient Time For Goal Achievement: 06/23/17 Potential to Achieve Goals: Good  OT Frequency: Min 2X/week   Barriers to D/C:            Co-evaluation              AM-PAC PT "6 Clicks" Daily Activity     Outcome Measure Help from another person eating meals?: None Help from another person taking care of personal grooming?: None Help from another person toileting, which includes using toliet, bedpan, or urinal?: A Little Help from another person bathing (including washing, rinsing, drying)?: A Little Help from another person to put on and taking off regular upper body clothing?: None Help from another person to put on and taking off regular lower body clothing?: A Little 6 Click Score: 21   End of Session Equipment Utilized During Treatment: Gait belt;Rolling walker Nurse Communication: Mobility status;Patient requests pain meds  Activity Tolerance: Patient tolerated treatment well Patient left: in chair;with call bell/phone within reach;with family/visitor present  OT Visit Diagnosis: Other abnormalities of gait and mobility (R26.89);Muscle weakness (generalized) (M62.81);Pain Pain - Right/Left: Right Pain - part of body: Hip                Time: 1610-9604 OT Time Calculation (min): 30 min Charges:  OT General Charges $OT Visit: 1 Visit OT Evaluation $OT Eval Low Complexity: 1 Low OT Treatments $Self Care/Home Management : 8-22 mins G-Codes:     [facility], OT/L  [phone] 06/09/2017  Lisa [name],HILLARY 06/09/2017, 9:36 AM

## 2017-06-09 NOTE — Progress Notes (Signed)
Physical Therapy Treatment Patient Details Name: Lisa [name] MRN: [id number] DOB: Jul 14, 1970 Today's Date: 06/09/2017    History of Present Illness 47 yo s/p RTHA - ant approach. PMH L THA;DM    PT Comments    Patient progressing well with therapy. Session focused on stair training, now supervision level for all OOB mobility. Pt educated on safety considerations for home and has no further questions or concerns at this time. Pt has met all functional goals and will benefit from skilled home health PT when medically cleared for d/c.      Follow Up Recommendations  Follow surgeon's recommendation for DC plan and follow-up therapies;Supervision/Assistance - 24 hour     Equipment Recommendations  Rolling walker with 5" wheels;3in1 (PT)    Recommendations for Other Services       Precautions / Restrictions Precautions Precautions: None Restrictions Weight Bearing Restrictions: Yes RLE Weight Bearing: Weight bearing as tolerated    Mobility  Bed Mobility Overal bed mobility: Needs Assistance Bed Mobility: Supine to Sit     Supine to sit: Supervision        Transfers Overall transfer level: Modified independent Equipment used: Rolling walker (2 wheeled) Transfers: Sit to/from Stand Sit to Stand: Supervision         General transfer comment: good hand placement, supervision for safety  Ambulation/Gait Ambulation/Gait assistance: Supervision Ambulation Distance (Feet): 300 Feet Assistive device: Rolling walker (2 wheeled) Gait Pattern/deviations: Step-through pattern;Step-to pattern;Antalgic     General Gait Details: Improving mechanics, now step through   Stairs Stairs: Yes   Stair Management: One rail Right;Step to pattern;Sideways;Forwards Number of Stairs: 24 General stair comments: several trials of sideway and forwards, patient safer with BUE support sideways and prefers that as well. Progrsesed to supervision. cues for sequencing and technique, pt  reports confidence in ability to perform today at home.   Wheelchair Mobility    Modified Rankin (Stroke Patients Only)       Balance Overall balance assessment: No apparent balance deficits (not formally assessed)                                          Cognition Arousal/Alertness: Awake/alert Behavior During Therapy: WFL for tasks assessed/performed Overall Cognitive Status: Within Functional Limits for tasks assessed                                        Exercises Total Joint Exercises Hip ABduction/ADduction: 10 reps Knee Flexion: 10 reps Marching in Standing: 10 reps    General Comments        Pertinent Vitals/Pain Pain Assessment: 0-10 Pain Score: 5  Pain Location: R hip Pain Descriptors / Indicators: Aching;Discomfort Pain Intervention(s): Limited activity within patient's tolerance;Monitored during session;Premedicated before session;Repositioned    Home Living Family/patient expects to be discharged to:: Private residence Living Arrangements: Spouse/significant other Available Help at Discharge: Available 24 hours/day Type of Home: House Home Access: Stairs to enter Entrance Stairs-Rails: Right Home Layout: One level Home Equipment: None      Prior Function Level of Independence: Independent      Comments: works in [employer] Goals (current goals can now be found in the care plan section) Acute Rehab PT Goals Patient Stated Goal: to go home PT Goal Formulation: With patient Time For Goal  Achievement: 06/16/17 Potential to Achieve Goals: Good Progress towards PT goals: Goals met/education completed, patient discharged from PT    Frequency    7X/week      PT Plan Current plan remains appropriate    Co-evaluation              AM-PAC PT "6 Clicks" Daily Activity  Outcome Measure  Difficulty turning over in bed (including adjusting bedclothes, sheets and blankets)?: A Little Difficulty  moving from lying on back to sitting on the side of the bed? : A Little Difficulty sitting down on and standing up from a chair with arms (e.g., wheelchair, bedside commode, etc,.)?: A Little Help needed moving to and from a bed to chair (including a wheelchair)?: A Little Help needed walking in hospital room?: A Little Help needed climbing 3-5 steps with a railing? : A Little 6 Click Score: 18    End of Session Equipment Utilized During Treatment: Gait belt Activity Tolerance: Patient tolerated treatment well Patient left: in bed;with call bell/phone within reach Nurse Communication: Mobility status PT Visit Diagnosis: Muscle weakness (generalized) (M62.81);Pain Pain - Right/Left: Right Pain - part of body: Hip     Time: 1250-1305 PT Time Calculation (min) (ACUTE ONLY): 15 min  Charges:  $Gait Training: 8-22 mins                    G Codes:       Reinaldo Berber, PT, DPT Acute Rehab Services Pager: [phone]     Reinaldo Berber 06/09/2017, 1:07 PM

## 2017-06-09 NOTE — Evaluation (Signed)
Physical Therapy Evaluation Patient Details Name: Lisa [name] MRN: [id number] DOB: 12-31-1970 Today's Date: 06/09/2017   History of Present Illness  47 yo s/p RTHA - ant approach. PMH L THA;DM  Clinical Impression  Patient is s/p above surgery resulting in functional limitations due to the deficits listed below (see PT Problem List). PTA, pt independnet with all mobiltiy living with husband in home with stairs to enter. Upon eval pt presents with post op pain and weakness, overall moving well with supervision for OOB mobility. Pt ambulated 300' with good mechanics. Will perform stairs this afternoon, anticpaite patient will do very well.  Patient will benefit from skilled PT to increase their independence and safety with mobility to allow discharge to the venue listed below.       Follow Up Recommendations Follow surgeon's recommendation for DC plan and follow-up therapies;Supervision/Assistance - 24 hour    Equipment Recommendations  Rolling walker with 5" wheels;3in1 (PT)    Recommendations for Other Services       Precautions / Restrictions Precautions Precautions: None Restrictions Weight Bearing Restrictions: Yes RLE Weight Bearing: Weight bearing as tolerated      Mobility  Bed Mobility Overal bed mobility: Needs Assistance Bed Mobility: Supine to Sit     Supine to sit: Supervision        Transfers Overall transfer level: Needs assistance Equipment used: Rolling walker (2 wheeled) Transfers: Sit to/from Stand Sit to Stand: Supervision         General transfer comment: good hand placement, supervision for safety  Ambulation/Gait Ambulation/Gait assistance: Min guard;Supervision Ambulation Distance (Feet): 300 Feet Assistive device: Rolling walker (2 wheeled) Gait Pattern/deviations: Step-through pattern;Step-to pattern;Antalgic     General Gait Details: Cues for step length, patient ambulating well overall with only mild decrease in velocity.    Stairs            Wheelchair Mobility    Modified Rankin (Stroke Patients Only)       Balance Overall balance assessment: No apparent balance deficits (not formally assessed)                                           Pertinent Vitals/Pain Pain Assessment: 0-10 Pain Score: 8  Pain Location: R hip Pain Descriptors / Indicators: Aching;Discomfort Pain Intervention(s): Limited activity within patient's tolerance;Monitored during session;Premedicated before session;Repositioned    Home Living Family/patient expects to be discharged to:: Private residence Living Arrangements: Spouse/significant other Available Help at Discharge: Available 24 hours/day Type of Home: House Home Access: Stairs to enter Entrance Stairs-Rails: Right Entrance Stairs-Number of Steps: 4 Home Layout: One level Home Equipment: None      Prior Function Level of Independence: Independent         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   Dominant Hand: Right    Extremity/Trunk Assessment   Upper Extremity Assessment Upper Extremity Assessment: Defer to OT evaluation    Lower Extremity Assessment Lower Extremity Assessment: Overall WFL for tasks assessed(R hip pain and weakness consistent with above procedure)    Cervical / Trunk Assessment Cervical / Trunk Assessment: Normal  Communication   Communication: No difficulties  Cognition Arousal/Alertness: Awake/alert Behavior During Therapy: WFL for tasks assessed/performed Overall Cognitive Status: Within Functional Limits for tasks assessed  General Comments      Exercises Total Joint Exercises Hip ABduction/ADduction: 10 reps Knee Flexion: 10 reps Marching in Standing: 10 reps   Assessment/Plan    PT Assessment Patient needs continued PT services  PT Problem List Decreased strength;Decreased range of motion;Decreased activity  tolerance;Decreased balance;Decreased mobility;Pain;Obesity       PT Treatment Interventions DME instruction;Gait training;Stair training;Functional mobility training;Therapeutic activities;Therapeutic exercise    PT Goals (Current goals can be found in the Care Plan section)  Acute Rehab PT Goals Patient Stated Goal: to go home PT Goal Formulation: With patient Time For Goal Achievement: 06/16/17 Potential to Achieve Goals: Good    Frequency 7X/week   Barriers to discharge        Co-evaluation               AM-PAC PT "6 Clicks" Daily Activity  Outcome Measure Difficulty turning over in bed (including adjusting bedclothes, sheets and blankets)?: A Little Difficulty moving from lying on back to sitting on the side of the bed? : A Little Difficulty sitting down on and standing up from a chair with arms (e.g., wheelchair, bedside commode, etc,.)?: A Little Help needed moving to and from a bed to chair (including a wheelchair)?: A Little Help needed walking in hospital room?: A Little Help needed climbing 3-5 steps with a railing? : A Little 6 Click Score: 18    End of Session Equipment Utilized During Treatment: Gait belt Activity Tolerance: Patient tolerated treatment well Patient left: in bed;with call bell/phone within reach Nurse Communication: Mobility status PT Visit Diagnosis: Muscle weakness (generalized) (M62.81);Pain Pain - Right/Left: Right Pain - part of body: Hip    Time: 1010-1035 PT Time Calculation (min) (ACUTE ONLY): 25 min   Charges:   PT Evaluation $PT Eval Low Complexity: 1 Low PT Treatments $Gait Training: 8-22 mins   PT G Codes:       [name], PT, DPT Acute Rehab Services Pager: [phone]    [name] 06/09/2017, 10:43 AM

## 2017-06-09 NOTE — Progress Notes (Signed)
    Subjective: Patient reports pain as mild to moderate.  Tolerating diet.  Urinating.  +Flatus.  No CP, SOB.  OOB in room.  Objective:   VITALS:   Vitals:   06/08/17 1651 06/08/17 1700 06/08/17 2100 06/09/17 0605  BP:  139/86 129/78 113/74  Pulse: 79 76 96 82  Resp: 14 20 20    Temp:  98.6 F (37 C) 98 F (36.7 C) 98.5 F (36.9 C)  TempSrc:  Oral Oral Oral  SpO2: 100% 94% 98% 99%  Weight:      Height:       CBC Latest Ref Rng & Units 05/28/2017 03/07/2014 02/28/2014  WBC 4.0 - 10.5 K/uL 5.4 9.9 4.4  Hemoglobin 12.0 - 15.0 g/dL 40.912.1 8.1(X9.8(L) 91.412.7  Hematocrit 36.0 - 46.0 % 38.4 29.9(L) 40.9  Platelets 150 - 400 K/uL 232 186 257   BMP Latest Ref Rng & Units 05/28/2017 03/07/2014 02/28/2014  Glucose 65 - 99 mg/dL 78(G60(L) 956(O141(H) 130(Q161(H)  BUN 6 - 20 mg/dL 15 14 16   Creatinine 0.44 - 1.00 mg/dL 6.57(Q1.02(H) 4.690.74 6.290.82  Sodium 135 - 145 mmol/L 142 139 142  Potassium 3.5 - 5.1 mmol/L 3.7 4.1 3.7  Chloride 101 - 111 mmol/L 106 107 101  CO2 22 - 32 mmol/L 27 28 28   Calcium 8.9 - 10.3 mg/dL 9.7 9.1 52.810.3   Intake/Output      03/26 0701 - 03/27 0700 03/27 0701 - 03/28 0700   P.O. 280    I.V. (mL/kg) 2072.9 (18.1)    IV Piggyback 100    Total Intake(mL/kg) 2452.9 (21.5)    Urine (mL/kg/hr) 750    Blood 200    Total Output 950    Net +1502.9         Urine Occurrence 3 x       Physical Exam: General: NAD.  Supine in bed.  Calm, conversant.  No increased work of breathing. Resp: No increased wob Cardio: regular rate and rhythm ABD soft Neurologically intact MSK Neurovascularly intact Sensation intact distally Intact pulses distally Dorsiflexion/Plantar flexion intact Incision: dressing C/D/I   Assessment: 1 Day Post-Op  S/P Procedure(s) (LRB): TOTAL HIP ARTHROPLASTY ANTERIOR APPROACH (Right) by Dr. Jewel Baizeimothy D. Murphy on 06/08/2017  Principal Problem:   Primary osteoarthritis of right hip Active Problems:   Tobacco use disorder   Diabetes (HCC)   Essential  hypertension   Primary osteoarthritis, status post total hip arthroplasty Doing well postop day 1. Eating, drinking, and voiding. Early mobilization in room.  Plan: Advance diet Up with therapy D/C IV fluids Incentive Spirometry Elevate and Apply ice   Weight Bearing: Weight Bearing as Tolerated (WBAT) RLE Dressings: Maintain Mepilex.   VTE prophylaxis: Aspirin, SCDs, ambulation Dispo: Home after therapy/mobilization today   Lisa BilletHenry Calvin Martensen III, PA-C 06/09/2017, 7:49 AM

## 2017-06-09 NOTE — Discharge Summary (Signed)
Discharge Summary  Patient ID: Lisa [name] MRN: [id number] DOB/AGE: November 24, 1970 47 y.o.  Admit date: 06/08/2017 Discharge date: 06/09/2017  Admission Diagnoses:  Primary osteoarthritis of right hip  Discharge Diagnoses:  Principal Problem:   Primary osteoarthritis of right hip Active Problems:   Tobacco use disorder   Diabetes (HCC)   Essential hypertension   Past Medical History:  Diagnosis Date  . Arthritis    Left hip avascular necrosis  . Avascular necrosis (HCC) left hip  . Diabetes mellitus without complication (HCC)   . Hypertension   . Neuromuscular disorder (HCC)    muscle spasms    Surgeries: Procedure(s): TOTAL HIP ARTHROPLASTY ANTERIOR APPROACH on 06/08/2017   Consultants (if any):   Discharged Condition: Improved  Hospital Course: Lisa [name] is an 47 y.o. female who was admitted 06/08/2017 with a diagnosis of Primary osteoarthritis of right hip and went to the operating room on 06/08/2017 and underwent the above named procedures.    She was given perioperative antibiotics:  Anti-infectives (From admission, onward)   Start     Dose/Rate Route Frequency Ordered Stop   06/08/17 1730  ceFAZolin (ANCEF) IVPB 2g/100 mL premix     2 g 200 mL/hr over 30 Minutes Intravenous Every 6 hours 06/08/17 1705 06/08/17 2343   06/08/17 1115  ceFAZolin (ANCEF) IVPB 2g/100 mL premix     2 g 200 mL/hr over 30 Minutes Intravenous On call to O.R. 06/08/17 0907 06/08/17 1220   06/08/17 0912  ceFAZolin (ANCEF) 2-4 GM/100ML-% IVPB    Note to Pharmacy:  Ray Church   : cabinet override      06/08/17 0912 06/08/17 1150    .  She was given sequential compression devices, early ambulation, and ASA 325 mg for DVT prophylaxis.  She benefited maximally from the hospital stay and there were no complications.    Recent vital signs:  Vitals:   06/08/17 2100 06/09/17 0605  BP: 129/78 113/74  Pulse: 96 82  Resp: 20   Temp: 98 F (36.7 C) 98.5 F (36.9 C)  SpO2: 98%  99%    Recent laboratory studies:  Lab Results  Component Value Date   HGB 12.1 05/28/2017   HGB 9.8 (L) 03/07/2014   HGB 12.7 02/28/2014   Lab Results  Component Value Date   WBC 5.4 05/28/2017   PLT 232 05/28/2017   Lab Results  Component Value Date   INR 1.03 02/28/2014   Lab Results  Component Value Date   NA 142 05/28/2017   K 3.7 05/28/2017   CL 106 05/28/2017   CO2 27 05/28/2017   BUN 15 05/28/2017   CREATININE 1.02 (H) 05/28/2017   GLUCOSE 60 (L) 05/28/2017    Discharge Medications:   Allergies as of 06/09/2017      Reactions   Latex Anaphylaxis   And peanuts as well   Peanut-containing Drug Products Anaphylaxis, Hives   Doxycycline Hives   Sulfa Antibiotics Hives      Medication List    STOP taking these medications   cyclobenzaprine 10 MG tablet Commonly known as:  FLEXERIL   ferrous sulfate 325 (65 FE) MG tablet   HYDROcodone-acetaminophen 5-325 MG tablet Commonly known as:  NORCO/VICODIN   polyethylene glycol packet Commonly known as:  MIRALAX / GLYCOLAX     TAKE these medications   acetaminophen 500 MG tablet Commonly known as:  TYLENOL Take 2 tablets (1,000 mg total) by mouth every 8 (eight) hours for 14 days. For Pain.  aspirin EC 325 MG tablet Take 1 tablet (325 mg total) by mouth daily. For 30 days post op for DVT Prophylaxis   citalopram 10 MG tablet Commonly known as:  CELEXA Take 10 mg by mouth daily.   docusate sodium 100 MG capsule Commonly known as:  COLACE Take 1 capsule (100 mg total) by mouth 2 (two) times daily. To prevent constipation while taking pain medication. What changed:  additional instructions   glyBURIDE-metformin 5-500 MG tablet Commonly known as:  GLUCOVANCE Take 1 tablet by mouth 2 (two) times daily.   ibuprofen 800 MG tablet Commonly known as:  ADVIL,MOTRIN Take 1 tablet (800 mg total) by mouth every 8 (eight) hours as needed for mild pain or moderate pain. What changed:    medication  strength  how much to take  when to take this  reasons to take this   losartan-hydrochlorothiazide 50-12.5 MG tablet Commonly known as:  HYZAAR Take 1 tablet by mouth daily.   methocarbamol 500 MG tablet Commonly known as:  ROBAXIN Take 1 tablet (500 mg total) by mouth every 6 (six) hours as needed for muscle spasms. What changed:    when to take this  reasons to take this   omeprazole 20 MG capsule Commonly known as:  PRILOSEC Take 1 capsule (20 mg total) by mouth daily. 30 days for gastroprotection while taking NSAIDs.   ondansetron 4 MG tablet Commonly known as:  ZOFRAN Take 1 tablet (4 mg total) by mouth every 8 (eight) hours as needed for nausea or vomiting.   oxyCODONE 5 MG immediate release tablet Commonly known as:  ROXICODONE Take 1 tablet (5 mg total) by mouth every 4 (four) hours as needed for breakthrough pain.       Diagnostic Studies: Dg C-arm 1-60 Min  Result Date: 06/08/2017 CLINICAL DATA:  Right total hip replacement EXAM: DG C-ARM 61-120 MIN COMPARISON:  Pelvis and right hip films of 01/30/2017 FINDINGS: C-arm fluoroscopy was provided during anterior right hip replacement. Fluoroscopy time of 26 seconds was recorded. IMPRESSION: C-arm fluoroscopy provided during right hip replacement. Electronically Signed   By: Dwyane DeePaul  Barry M.D.   On: 06/08/2017 13:48   Dg Hip Operative Unilat W Or W/o Pelvis Right  Result Date: 06/08/2017 CLINICAL DATA:  Right total hip replacement EXAM: OPERATIVE right HIP (WITH PELVIS IF PERFORMED) 2 VIEWS TECHNIQUE: Fluoroscopic spot image(s) were submitted for interpretation post-operatively. COMPARISON:  Pelvis and right hip films of 06/12/2016 FINDINGS: Two C-arm spot films show a right total hip replacement components in good position. No complicating features are seen. Fluoroscopy time of 26 seconds was recorded. IMPRESSION: Right total hip replacement components in good position with no complicating features. Electronically Signed    By: Dwyane DeePaul  Barry M.D.   On: 06/08/2017 13:48    Disposition:     Follow-up Information    Sheral ApleyMurphy, Timothy D, MD Follow up.   Specialty:  Orthopedic Surgery Contact information: [address] [place] [phone]            Signed: Albina BilletHenry Calvin Martensen III PA-C 06/09/2017, 12:52 PM

## 2017-06-09 NOTE — Progress Notes (Signed)
Occupational Therapy Treatment Patient Details Name: Lisa [name] MRN: [id number] DOB: [birth year] Today's Date: 06/09/2017    History of present illness 47 yo s/p RTHA - ant approach. PMH L THA;DM   OT comments  Completed all education regarding ADL and functional mobility for ADL @ RW level. Pt modified independent with use of AE and DME. Pt safe to DC home with intermittent S when medically stable. OT signing off.   Follow Up Recommendations  No OT follow up;Supervision - Intermittent    Equipment Recommendations  3 in 1 bedside commode    Recommendations for Other Services      Precautions / Restrictions Precautions Precautions: None Restrictions Weight Bearing Restrictions: Yes RLE Weight Bearing: Weight bearing as tolerated       Mobility    Transfers Overall transfer level: Modified independent    Balance Overall balance assessment: No apparent balance deficits (not formally assessed)                                         ADL either performed or assessed with clinical judgement   ADL Completed education on LB ADL with use of AE. Pt modified independent with AE and able to return demonstrate.  Educated pt on safe tub transfer techniques. Pt abel to return demonstrate.     Vision       Perception     Praxis      Cognition Arousal/Alertness: Awake/alert Behavior During Therapy: WFL for tasks assessed/performed Overall Cognitive Status: Within Functional Limits for tasks assessed                                          Exercises   Shoulder Instructions       General Comments      Pertinent Vitals/ Pain       Pain Assessment: 0-10 Pain Score: 5  Pain Location: R hip Pain Descriptors / Indicators: Aching;Discomfort Pain Intervention(s): Limited activity within patient's tolerance  Home Living Family/patient expects to be discharged to:: Private residence Living Arrangements: Spouse/significant  other Available Help at Discharge: Available 24 hours/day Type of Home: House Home Access: Stairs to enter Technical brewer of Steps: 4 Entrance Stairs-Rails: Right Home Layout: One level     Bathroom Shower/Tub: Corporate investment banker: Standard Bathroom Accessibility: Yes How Accessible: Accessible via walker Home Equipment: None          Prior Functioning/Environment Level of Independence: Independent        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        Progress Toward Goals  OT Goals(current goals can now be found in the care plan section)  Progress towards OT goals: Goals met/education completed, patient discharged from OT  Acute Rehab OT Goals Patient Stated Goal: to go home OT Goal Formulation: With patient Time For Goal Achievement: 06/23/17 Potential to Achieve Goals: Good ADL Goals Pt Will Perform Lower Body Bathing: with modified independence;with adaptive equipment;sit to/from stand Pt Will Perform Lower Body Dressing: with modified independence;sit to/from stand;with adaptive equipment Pt Will Perform Tub/Shower Transfer: Tub transfer;rolling walker;3 in 1;with caregiver independent in assisting;with supervision  Plan All goals met and education completed, patient discharged from OT services    Co-evaluation  AM-PAC PT "6 Clicks" Daily Activity     Outcome Measure   Help from another person eating meals?: None Help from another person taking care of personal grooming?: None Help from another person toileting, which includes using toliet, bedpan, or urinal?: None Help from another person bathing (including washing, rinsing, drying)?: A Little Help from another person to put on and taking off regular upper body clothing?: None Help from another person to put on and taking off regular lower body clothing?: A Little 6 Click Score: 22    End of Session Equipment Utilized During  Treatment: Gait belt;Rolling walker  OT Visit Diagnosis: Other abnormalities of gait and mobility (R26.89);Muscle weakness (generalized) (M62.81);Pain Pain - Right/Left: Right Pain - part of body: Hip   Activity Tolerance Patient tolerated treatment well   Patient Left in chair;with call bell/phone within reach   Nurse Communication Mobility status        Time: 2761-8485 OT Time Calculation (min): 23 min  Charges: OT General Charges $OT Visit: 1 Visit  OT Treatments $Self Care/Home Management : 23-37 mins  [facility], OT/L  [phone] 06/09/2017   Gwendloyn Forsee,HILLARY 06/09/2017, 12:24 PM

## 2017-06-09 NOTE — OR Nursing (Signed)
LATE ENTRY: Entered missing implant [id number] verified with RN circulator signed request to purchase.

## 2017-06-09 NOTE — Care Management Note (Signed)
Case Management Note  Patient Details  Name: Jae Direngela R Greathouse MRN: [id number] Date of Birth: 05/04/1970  Subjective/Objective:  47 yr old female s/p right total hip arthroplasty.                  Action/Plan: patient was preoperatively setup with [facility], no changes. She will have family support at discharge.    Expected Discharge Date:   06/09/17               Expected Discharge Plan:  Home w Home Health Services  In-House Referral:  NA  Discharge planning Services  CM Consult  Post Acute Care Choice:  Durable Medical Equipment, Home Health Choice offered to:  Patient  DME Arranged:  3-N-1, Walker rolling DME Agency:  [employer]  HH Arranged:  PT HH Agency:  [facility]  Status of Service:  Completed, signed off  If discussed at [employer] Meetings, dates discussed:    Additional Comments:  Durenda GuthrieBrady, Yusuke Beza Naomi, RN 06/09/2017, 12:58 PM

## 2017-06-09 NOTE — Anesthesia Postprocedure Evaluation (Signed)
Anesthesia Post Note  Patient: Lisa [name]  Procedure(s) Performed: TOTAL HIP ARTHROPLASTY ANTERIOR APPROACH (Right Hip)     Patient location during evaluation: SICU Anesthesia Type: Spinal Level of consciousness: sedated Pain management: pain level controlled Vital Signs Assessment: post-procedure vital signs reviewed and stable Respiratory status: patient remains intubated per anesthesia plan Cardiovascular status: stable Postop Assessment: no apparent nausea or vomiting Anesthetic complications: no    Last Vitals:  Vitals:   06/08/17 2100 06/09/17 0605  BP: 129/78 113/74  Pulse: 96 82  Resp: 20   Temp: 36.7 C 36.9 C  SpO2: 98% 99%    Last Pain:  Vitals:   06/09/17 0605  TempSrc: Oral  PainSc:                  Prentice Sackrider

## 2017-06-10 ENCOUNTER — Encounter (HOSPITAL_COMMUNITY): Payer: Self-pay | Admitting: Orthopedic Surgery

## 2017-06-18 DIAGNOSIS — M1611 Unilateral primary osteoarthritis, right hip: Secondary | ICD-10-CM | POA: Diagnosis not present

## 2017-07-16 DIAGNOSIS — M1611 Unilateral primary osteoarthritis, right hip: Secondary | ICD-10-CM | POA: Diagnosis not present

## 2017-07-17 DIAGNOSIS — I10 Essential (primary) hypertension: Secondary | ICD-10-CM | POA: Diagnosis not present

## 2017-07-17 DIAGNOSIS — E119 Type 2 diabetes mellitus without complications: Secondary | ICD-10-CM | POA: Diagnosis not present

## 2017-07-17 DIAGNOSIS — Z79899 Other long term (current) drug therapy: Secondary | ICD-10-CM | POA: Diagnosis not present

## 2017-07-17 DIAGNOSIS — S8012XA Contusion of left lower leg, initial encounter: Secondary | ICD-10-CM | POA: Diagnosis not present

## 2017-07-17 DIAGNOSIS — M79605 Pain in left leg: Secondary | ICD-10-CM | POA: Diagnosis not present

## 2017-07-17 DIAGNOSIS — Z7984 Long term (current) use of oral hypoglycemic drugs: Secondary | ICD-10-CM | POA: Diagnosis not present

## 2017-07-17 DIAGNOSIS — F17200 Nicotine dependence, unspecified, uncomplicated: Secondary | ICD-10-CM | POA: Diagnosis not present

## 2017-07-28 DIAGNOSIS — R531 Weakness: Secondary | ICD-10-CM | POA: Diagnosis not present

## 2017-07-28 DIAGNOSIS — Z96641 Presence of right artificial hip joint: Secondary | ICD-10-CM | POA: Diagnosis not present

## 2017-07-28 DIAGNOSIS — M25551 Pain in right hip: Secondary | ICD-10-CM | POA: Diagnosis not present

## 2017-07-30 DIAGNOSIS — M87859 Other osteonecrosis, unspecified femur: Secondary | ICD-10-CM | POA: Diagnosis not present

## 2017-07-30 DIAGNOSIS — Z6841 Body Mass Index (BMI) 40.0 and over, adult: Secondary | ICD-10-CM | POA: Diagnosis not present

## 2017-07-30 DIAGNOSIS — Z1389 Encounter for screening for other disorder: Secondary | ICD-10-CM | POA: Diagnosis not present

## 2017-08-03 DIAGNOSIS — Z96641 Presence of right artificial hip joint: Secondary | ICD-10-CM | POA: Diagnosis not present

## 2017-08-03 DIAGNOSIS — M25551 Pain in right hip: Secondary | ICD-10-CM | POA: Diagnosis not present

## 2017-08-03 DIAGNOSIS — R531 Weakness: Secondary | ICD-10-CM | POA: Diagnosis not present

## 2017-08-17 DIAGNOSIS — Z96641 Presence of right artificial hip joint: Secondary | ICD-10-CM | POA: Diagnosis not present

## 2017-08-17 DIAGNOSIS — R531 Weakness: Secondary | ICD-10-CM | POA: Diagnosis not present

## 2017-08-17 DIAGNOSIS — M25551 Pain in right hip: Secondary | ICD-10-CM | POA: Diagnosis not present

## 2017-08-19 DIAGNOSIS — Z96641 Presence of right artificial hip joint: Secondary | ICD-10-CM | POA: Diagnosis not present

## 2017-08-19 DIAGNOSIS — M25551 Pain in right hip: Secondary | ICD-10-CM | POA: Diagnosis not present

## 2017-08-19 DIAGNOSIS — R531 Weakness: Secondary | ICD-10-CM | POA: Diagnosis not present

## 2017-08-24 DIAGNOSIS — Z96641 Presence of right artificial hip joint: Secondary | ICD-10-CM | POA: Diagnosis not present

## 2017-08-24 DIAGNOSIS — M25551 Pain in right hip: Secondary | ICD-10-CM | POA: Diagnosis not present

## 2017-08-24 DIAGNOSIS — R531 Weakness: Secondary | ICD-10-CM | POA: Diagnosis not present

## 2017-08-26 DIAGNOSIS — M25551 Pain in right hip: Secondary | ICD-10-CM | POA: Diagnosis not present

## 2017-08-26 DIAGNOSIS — R531 Weakness: Secondary | ICD-10-CM | POA: Diagnosis not present

## 2017-08-26 DIAGNOSIS — Z96641 Presence of right artificial hip joint: Secondary | ICD-10-CM | POA: Diagnosis not present

## 2017-09-02 DIAGNOSIS — Z96641 Presence of right artificial hip joint: Secondary | ICD-10-CM | POA: Diagnosis not present

## 2017-09-02 DIAGNOSIS — M25551 Pain in right hip: Secondary | ICD-10-CM | POA: Diagnosis not present

## 2017-09-02 DIAGNOSIS — R531 Weakness: Secondary | ICD-10-CM | POA: Diagnosis not present

## 2017-09-07 DIAGNOSIS — Z96641 Presence of right artificial hip joint: Secondary | ICD-10-CM | POA: Diagnosis not present

## 2017-09-07 DIAGNOSIS — M25551 Pain in right hip: Secondary | ICD-10-CM | POA: Diagnosis not present

## 2017-09-07 DIAGNOSIS — R531 Weakness: Secondary | ICD-10-CM | POA: Diagnosis not present

## 2017-09-10 DIAGNOSIS — M1611 Unilateral primary osteoarthritis, right hip: Secondary | ICD-10-CM | POA: Diagnosis not present

## 2017-09-14 DIAGNOSIS — Z96641 Presence of right artificial hip joint: Secondary | ICD-10-CM | POA: Diagnosis not present

## 2017-09-14 DIAGNOSIS — M25551 Pain in right hip: Secondary | ICD-10-CM | POA: Diagnosis not present

## 2017-09-14 DIAGNOSIS — R531 Weakness: Secondary | ICD-10-CM | POA: Diagnosis not present

## 2017-09-28 DIAGNOSIS — Z96641 Presence of right artificial hip joint: Secondary | ICD-10-CM | POA: Diagnosis not present

## 2017-09-28 DIAGNOSIS — R531 Weakness: Secondary | ICD-10-CM | POA: Diagnosis not present

## 2017-09-28 DIAGNOSIS — M25551 Pain in right hip: Secondary | ICD-10-CM | POA: Diagnosis not present

## 2017-09-30 DIAGNOSIS — R531 Weakness: Secondary | ICD-10-CM | POA: Diagnosis not present

## 2017-09-30 DIAGNOSIS — M25551 Pain in right hip: Secondary | ICD-10-CM | POA: Diagnosis not present

## 2017-09-30 DIAGNOSIS — Z96641 Presence of right artificial hip joint: Secondary | ICD-10-CM | POA: Diagnosis not present

## 2017-10-07 DIAGNOSIS — Z96641 Presence of right artificial hip joint: Secondary | ICD-10-CM | POA: Diagnosis not present

## 2017-10-07 DIAGNOSIS — M25551 Pain in right hip: Secondary | ICD-10-CM | POA: Diagnosis not present

## 2017-10-07 DIAGNOSIS — R531 Weakness: Secondary | ICD-10-CM | POA: Diagnosis not present

## 2017-10-11 DIAGNOSIS — M1611 Unilateral primary osteoarthritis, right hip: Secondary | ICD-10-CM | POA: Diagnosis not present

## 2017-10-14 DIAGNOSIS — M25551 Pain in right hip: Secondary | ICD-10-CM | POA: Diagnosis not present

## 2017-10-14 DIAGNOSIS — R531 Weakness: Secondary | ICD-10-CM | POA: Diagnosis not present

## 2017-10-14 DIAGNOSIS — Z96641 Presence of right artificial hip joint: Secondary | ICD-10-CM | POA: Diagnosis not present

## 2017-11-19 DIAGNOSIS — M1611 Unilateral primary osteoarthritis, right hip: Secondary | ICD-10-CM | POA: Diagnosis not present

## 2017-12-01 DIAGNOSIS — M25551 Pain in right hip: Secondary | ICD-10-CM | POA: Diagnosis not present

## 2017-12-01 DIAGNOSIS — M25651 Stiffness of right hip, not elsewhere classified: Secondary | ICD-10-CM | POA: Diagnosis not present

## 2017-12-07 DIAGNOSIS — M25651 Stiffness of right hip, not elsewhere classified: Secondary | ICD-10-CM | POA: Diagnosis not present

## 2017-12-07 DIAGNOSIS — M25551 Pain in right hip: Secondary | ICD-10-CM | POA: Diagnosis not present

## 2017-12-09 DIAGNOSIS — M25551 Pain in right hip: Secondary | ICD-10-CM | POA: Diagnosis not present

## 2017-12-09 DIAGNOSIS — M25651 Stiffness of right hip, not elsewhere classified: Secondary | ICD-10-CM | POA: Diagnosis not present

## 2017-12-13 DIAGNOSIS — Z0000 Encounter for general adult medical examination without abnormal findings: Secondary | ICD-10-CM | POA: Diagnosis not present

## 2017-12-13 DIAGNOSIS — Z0001 Encounter for general adult medical examination with abnormal findings: Secondary | ICD-10-CM | POA: Diagnosis not present

## 2017-12-13 DIAGNOSIS — M87859 Other osteonecrosis, unspecified femur: Secondary | ICD-10-CM | POA: Diagnosis not present

## 2017-12-13 DIAGNOSIS — E119 Type 2 diabetes mellitus without complications: Secondary | ICD-10-CM | POA: Diagnosis not present

## 2017-12-13 DIAGNOSIS — Z6841 Body Mass Index (BMI) 40.0 and over, adult: Secondary | ICD-10-CM | POA: Diagnosis not present

## 2017-12-13 DIAGNOSIS — Z23 Encounter for immunization: Secondary | ICD-10-CM | POA: Diagnosis not present

## 2017-12-13 DIAGNOSIS — Z1389 Encounter for screening for other disorder: Secondary | ICD-10-CM | POA: Diagnosis not present

## 2017-12-14 DIAGNOSIS — Z1389 Encounter for screening for other disorder: Secondary | ICD-10-CM | POA: Diagnosis not present

## 2017-12-14 DIAGNOSIS — Z6841 Body Mass Index (BMI) 40.0 and over, adult: Secondary | ICD-10-CM | POA: Diagnosis not present

## 2017-12-14 DIAGNOSIS — E119 Type 2 diabetes mellitus without complications: Secondary | ICD-10-CM | POA: Diagnosis not present

## 2017-12-15 DIAGNOSIS — M25651 Stiffness of right hip, not elsewhere classified: Secondary | ICD-10-CM | POA: Diagnosis not present

## 2017-12-15 DIAGNOSIS — M25551 Pain in right hip: Secondary | ICD-10-CM | POA: Diagnosis not present

## 2017-12-21 DIAGNOSIS — M25651 Stiffness of right hip, not elsewhere classified: Secondary | ICD-10-CM | POA: Diagnosis not present

## 2017-12-21 DIAGNOSIS — M25551 Pain in right hip: Secondary | ICD-10-CM | POA: Diagnosis not present

## 2017-12-23 DIAGNOSIS — M25551 Pain in right hip: Secondary | ICD-10-CM | POA: Diagnosis not present

## 2017-12-23 DIAGNOSIS — M25651 Stiffness of right hip, not elsewhere classified: Secondary | ICD-10-CM | POA: Diagnosis not present

## 2017-12-30 DIAGNOSIS — M25651 Stiffness of right hip, not elsewhere classified: Secondary | ICD-10-CM | POA: Diagnosis not present

## 2017-12-30 DIAGNOSIS — M25551 Pain in right hip: Secondary | ICD-10-CM | POA: Diagnosis not present

## 2018-01-04 DIAGNOSIS — Z1231 Encounter for screening mammogram for malignant neoplasm of breast: Secondary | ICD-10-CM | POA: Diagnosis not present

## 2018-01-04 DIAGNOSIS — M25551 Pain in right hip: Secondary | ICD-10-CM | POA: Diagnosis not present

## 2018-01-04 DIAGNOSIS — M25651 Stiffness of right hip, not elsewhere classified: Secondary | ICD-10-CM | POA: Diagnosis not present

## 2018-01-06 DIAGNOSIS — M25651 Stiffness of right hip, not elsewhere classified: Secondary | ICD-10-CM | POA: Diagnosis not present

## 2018-01-06 DIAGNOSIS — M25551 Pain in right hip: Secondary | ICD-10-CM | POA: Diagnosis not present

## 2018-01-11 DIAGNOSIS — M25651 Stiffness of right hip, not elsewhere classified: Secondary | ICD-10-CM | POA: Diagnosis not present

## 2018-01-11 DIAGNOSIS — M25551 Pain in right hip: Secondary | ICD-10-CM | POA: Diagnosis not present

## 2018-01-12 DIAGNOSIS — M87851 Other osteonecrosis, right femur: Secondary | ICD-10-CM | POA: Diagnosis not present

## 2018-01-12 DIAGNOSIS — Z681 Body mass index (BMI) 19 or less, adult: Secondary | ICD-10-CM | POA: Diagnosis not present

## 2018-01-12 DIAGNOSIS — J3489 Other specified disorders of nose and nasal sinuses: Secondary | ICD-10-CM | POA: Diagnosis not present

## 2018-01-13 DIAGNOSIS — M25551 Pain in right hip: Secondary | ICD-10-CM | POA: Diagnosis not present

## 2018-01-13 DIAGNOSIS — M25651 Stiffness of right hip, not elsewhere classified: Secondary | ICD-10-CM | POA: Diagnosis not present

## 2018-01-18 DIAGNOSIS — M25651 Stiffness of right hip, not elsewhere classified: Secondary | ICD-10-CM | POA: Diagnosis not present

## 2018-01-18 DIAGNOSIS — M25551 Pain in right hip: Secondary | ICD-10-CM | POA: Diagnosis not present

## 2018-01-20 DIAGNOSIS — M25651 Stiffness of right hip, not elsewhere classified: Secondary | ICD-10-CM | POA: Diagnosis not present

## 2018-01-20 DIAGNOSIS — M25551 Pain in right hip: Secondary | ICD-10-CM | POA: Diagnosis not present

## 2018-01-24 DIAGNOSIS — M1611 Unilateral primary osteoarthritis, right hip: Secondary | ICD-10-CM | POA: Diagnosis not present

## 2018-02-01 DIAGNOSIS — M25651 Stiffness of right hip, not elsewhere classified: Secondary | ICD-10-CM | POA: Diagnosis not present

## 2018-02-01 DIAGNOSIS — M25551 Pain in right hip: Secondary | ICD-10-CM | POA: Diagnosis not present

## 2018-02-03 DIAGNOSIS — M25651 Stiffness of right hip, not elsewhere classified: Secondary | ICD-10-CM | POA: Diagnosis not present

## 2018-02-03 DIAGNOSIS — M25551 Pain in right hip: Secondary | ICD-10-CM | POA: Diagnosis not present

## 2018-02-15 DIAGNOSIS — M87859 Other osteonecrosis, unspecified femur: Secondary | ICD-10-CM | POA: Diagnosis not present

## 2018-02-15 DIAGNOSIS — G894 Chronic pain syndrome: Secondary | ICD-10-CM | POA: Diagnosis not present

## 2018-02-15 DIAGNOSIS — Z6841 Body Mass Index (BMI) 40.0 and over, adult: Secondary | ICD-10-CM | POA: Diagnosis not present

## 2018-02-15 DIAGNOSIS — Z1389 Encounter for screening for other disorder: Secondary | ICD-10-CM | POA: Diagnosis not present

## 2018-03-14 DIAGNOSIS — Z1389 Encounter for screening for other disorder: Secondary | ICD-10-CM | POA: Diagnosis not present

## 2018-03-14 DIAGNOSIS — M87851 Other osteonecrosis, right femur: Secondary | ICD-10-CM | POA: Diagnosis not present

## 2018-03-14 DIAGNOSIS — G4709 Other insomnia: Secondary | ICD-10-CM | POA: Diagnosis not present

## 2018-03-14 DIAGNOSIS — Z6841 Body Mass Index (BMI) 40.0 and over, adult: Secondary | ICD-10-CM | POA: Diagnosis not present

## 2018-03-14 DIAGNOSIS — G894 Chronic pain syndrome: Secondary | ICD-10-CM | POA: Diagnosis not present

## 2018-04-18 DIAGNOSIS — E119 Type 2 diabetes mellitus without complications: Secondary | ICD-10-CM | POA: Diagnosis not present

## 2018-04-18 DIAGNOSIS — G894 Chronic pain syndrome: Secondary | ICD-10-CM | POA: Diagnosis not present

## 2018-04-18 DIAGNOSIS — Z6841 Body Mass Index (BMI) 40.0 and over, adult: Secondary | ICD-10-CM | POA: Diagnosis not present

## 2018-04-18 DIAGNOSIS — M67834 Other specified disorders of tendon, left wrist: Secondary | ICD-10-CM | POA: Diagnosis not present

## 2018-04-18 DIAGNOSIS — I10 Essential (primary) hypertension: Secondary | ICD-10-CM | POA: Diagnosis not present

## 2018-04-18 DIAGNOSIS — Z1389 Encounter for screening for other disorder: Secondary | ICD-10-CM | POA: Diagnosis not present

## 2020-01-18 ENCOUNTER — Other Ambulatory Visit: Payer: Self-pay | Admitting: Physician Assistant

## 2020-01-18 ENCOUNTER — Other Ambulatory Visit: Payer: Self-pay

## 2020-01-18 ENCOUNTER — Ambulatory Visit (HOSPITAL_COMMUNITY)
Admission: RE | Admit: 2020-01-18 | Discharge: 2020-01-18 | Disposition: A | Discharge status: Home / Self Care | Payer: Self-pay | Source: Ambulatory Visit | Attending: Physician Assistant | Admitting: Physician Assistant

## 2020-01-18 ENCOUNTER — Encounter (HOSPITAL_COMMUNITY): Payer: Self-pay

## 2020-01-18 ENCOUNTER — Encounter: Payer: Self-pay | Admitting: Physician Assistant

## 2020-01-18 ENCOUNTER — Ambulatory Visit: Payer: Medicaid Other | Admitting: Physician Assistant

## 2020-01-18 VITALS — BP 160/102 | HR 68 | Temp 98.1°F | Ht 64.5 in | Wt 252.0 lb

## 2020-01-18 DIAGNOSIS — R52 Pain, unspecified: Secondary | ICD-10-CM

## 2020-01-18 DIAGNOSIS — Z7689 Persons encountering health services in other specified circumstances: Secondary | ICD-10-CM

## 2020-01-18 DIAGNOSIS — E119 Type 2 diabetes mellitus without complications: Secondary | ICD-10-CM

## 2020-01-18 DIAGNOSIS — R609 Edema, unspecified: Secondary | ICD-10-CM | POA: Insufficient documentation

## 2020-01-18 DIAGNOSIS — M25551 Pain in right hip: Secondary | ICD-10-CM

## 2020-01-18 DIAGNOSIS — Z96642 Presence of left artificial hip joint: Secondary | ICD-10-CM

## 2020-01-18 DIAGNOSIS — F32A Depression, unspecified: Secondary | ICD-10-CM

## 2020-01-18 DIAGNOSIS — Z96641 Presence of right artificial hip joint: Secondary | ICD-10-CM

## 2020-01-18 DIAGNOSIS — I10 Essential (primary) hypertension: Secondary | ICD-10-CM

## 2020-01-18 DIAGNOSIS — Z1239 Encounter for other screening for malignant neoplasm of breast: Secondary | ICD-10-CM

## 2020-01-18 DIAGNOSIS — K219 Gastro-esophageal reflux disease without esophagitis: Secondary | ICD-10-CM

## 2020-01-18 LAB — POCT I-STAT CREATININE: Creatinine, Ser: 1.1 mg/dL — ABNORMAL HIGH (ref 0.44–1.00)

## 2020-01-18 MED ORDER — DICLOFENAC SODIUM 75 MG PO TBEC: 75.0000 mg | DELAYED_RELEASE_TABLET | Freq: Two times a day (BID) | ORAL | 0 refills | Status: DC

## 2020-01-18 MED ORDER — METFORMIN HCL 500 MG PO TABS: 500.0000 mg | ORAL_TABLET | Freq: Two times a day (BID) | ORAL | 0 refills | Status: DC

## 2020-01-18 MED ORDER — CYCLOBENZAPRINE HCL 10 MG PO TABS: 10.0000 mg | ORAL_TABLET | Freq: Three times a day (TID) | ORAL | 0 refills | Status: DC | PRN

## 2020-01-18 MED ORDER — IOHEXOL 300 MG/ML  SOLN
75.0000 mL | Freq: Once | INTRAMUSCULAR | Status: AC | PRN
  Administered 2020-01-18: 75 mL via INTRAVENOUS

## 2020-01-18 MED ORDER — LOSARTAN POTASSIUM 50 MG PO TABS: 50.0000 mg | ORAL_TABLET | Freq: Every day | ORAL | 0 refills | Status: DC

## 2020-01-18 MED ORDER — OMEPRAZOLE 20 MG PO CPDR: 20.0000 mg | DELAYED_RELEASE_CAPSULE | Freq: Every day | ORAL | 0 refills | Status: DC

## 2020-01-18 MED ORDER — CITALOPRAM HYDROBROMIDE 10 MG PO TABS: 10.0000 mg | ORAL_TABLET | Freq: Every day | ORAL | 0 refills | Status: DC

## 2020-01-18 NOTE — Progress Notes (Signed)
BP (!) 160/102   Pulse 68   Temp 98.1 F (36.7 C)   Ht 5' 4.5" (1.638 m)   Wt 252 lb (114.3 kg)   SpO2 99%   BMI 42.59 kg/m    Subjective:    Patient ID: Lisa [name], female    DOB: [birth year], 49 y.o.   MRN: [id number]  HPI: Lisa [name] is a 49 y.o. female presenting on 01/18/2020 for New Patient (Initial Visit) (pt last PCP was Dr. Phillips Odor which she last saw 3 months ago. pt used to work at [employer] but lost her insurance when she stopped working)   HPI   Pt had a negative covid 19 screening questionnaire.   Pt is 48yoF who presents to establish care.  Pt had B THA due to AVN.  She says she completed PT after her surgeries.  Pt says she was released from orthopedics.  She says her R hip his now swelling which is increasing over the past 2-3 months and is now so bad she can hardly walk.  She says she didn't call the orthopedist to see what to do because she no longer has insurance.    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  She has applied for disability.  She says she has been out of her DM and HTN meds for 2 or 3 months.   She was diagnosed with DM in her 30s.    She got covid vaccination but doesn't have her card with her.  She says Citalopram worked well for her.  She says she was having some depression due to everything going on with her hips and having to quit work, etc.  She has no SI, HI.     Relevant past medical, surgical, family and social history reviewed and updated as indicated. Interim medical history since our last visit reviewed. Allergies and medications reviewed and updated.    Current Outpatient Medications:  .  aspirin EC 325 MG tablet, Take 1 tablet (325 mg total) by mouth daily. For 30 days post op for DVT Prophylaxis (Patient not taking: Reported on 01/18/2020), Disp: 30 tablet, Rfl: 0 .  citalopram (CELEXA) 10 MG tablet, Take 10 mg by mouth daily. (Patient not taking: Reported on 01/18/2020), Disp: , Rfl:  .   docusate sodium (COLACE) 100 MG capsule, Take 1 capsule (100 mg total) by mouth 2 (two) times daily. To prevent constipation while taking pain medication. (Patient not taking: Reported on 01/18/2020), Disp: 60 capsule, Rfl: 0 .  glyBURIDE-metformin (GLUCOVANCE) 5-500 MG tablet, Take 1 tablet by mouth 2 (two) times daily. (Patient not taking: Reported on 01/18/2020), Disp: , Rfl:  .  losartan-hydrochlorothiazide (HYZAAR) 50-12.5 MG tablet, Take 1 tablet by mouth daily. (Patient not taking: Reported on 01/18/2020), Disp: , Rfl:  .  methocarbamol (ROBAXIN) 500 MG tablet, Take 1 tablet (500 mg total) by mouth every 6 (six) hours as needed for muscle spasms. (Patient not taking: Reported on 01/18/2020), Disp: 40 tablet, Rfl: 0 .  omeprazole (PRILOSEC) 20 MG capsule, Take 1 capsule (20 mg total) by mouth daily. 30 days for gastroprotection while taking NSAIDs. (Patient not taking: Reported on 01/18/2020), Disp: 30 capsule, Rfl: 0 .  ondansetron (ZOFRAN) 4 MG tablet, Take 1 tablet (4 mg total) by mouth every 8 (eight) hours as needed for nausea or vomiting. (Patient not taking: Reported on 01/18/2020), Disp: 40 tablet, Rfl: 0    Review of Systems  Per HPI unless  specifically indicated above     Objective:    BP (!) 160/102   Pulse 68   Temp 98.1 F (36.7 C)   Ht 5' 4.5" (1.638 m)   Wt 252 lb (114.3 kg)   SpO2 99%   BMI 42.59 kg/m   Wt Readings from Last 3 Encounters:  01/18/20 252 lb (114.3 kg)  06/08/17 252 lb (114.3 kg)  05/28/17 (P) 252 lb 9 oz (114.6 kg)    Physical Exam Vitals reviewed.  Constitutional:      General: She is not in acute distress.    Appearance: She is well-developed. She is obese. She is not toxic-appearing.  HENT:     Head: Normocephalic and atraumatic.     Ears:     Comments: Cerumen bilaterally Eyes:     Conjunctiva/sclera: Conjunctivae normal.     Pupils: Pupils are equal, round, and reactive to light.  Neck:     Thyroid: No thyromegaly.  Cardiovascular:      Rate and Rhythm: Normal rate and regular rhythm.  Pulmonary:     Effort: Pulmonary effort is normal.     Breath sounds: Normal breath sounds.  Abdominal:     General: Bowel sounds are normal.     Palpations: Abdomen is soft. There is no mass.     Tenderness: There is no abdominal tenderness.  Musculoskeletal:     Cervical back: Neck supple.     Right hip: Tenderness present.     Left hip: No deformity or tenderness.     Right upper leg: Tenderness present.     Right lower leg: No edema.     Left lower leg: No edema.     Comments: Exam limited due to pt pain.  Just light touch on her upper thigh causes her to cry out in pain and her leg starts shaking and twitching.  Int/ext rotation testing of the hip causes pain.  Unable to palpate the thigh to determine if swelling or lump is present due to pt won't tolerate.  Unable to determine swelling by looking at the thgih due to body habitus.   Lymphadenopathy:     Cervical: No cervical adenopathy.  Skin:    General: Skin is warm and dry.  Neurological:     Mental Status: She is alert and oriented to person, place, and time.     Motor: No weakness or tremor.     Gait: Gait abnormal.  Psychiatric:        Attention and Perception: Attention normal.        Mood and Affect: Mood normal.        Speech: Speech normal.        Behavior: Behavior normal. Behavior is cooperative.           Assessment & Plan:   Encounter Diagnoses  Name Primary?  . Encounter to establish care Yes  . Swelling   . Pain   . S/P total right hip arthroplasty   . Essential hypertension   . Type 2 diabetes mellitus without complication, without long-term current use of insulin (HCC)   . Gastroesophageal reflux disease, unspecified whether esophagitis present   . Morbid obesity (HCC)   . S/P total left hip arthroplasty   . Depression, unspecified depression type   . Encounter for screening for malignant neoplasm of breast, unspecified screening modality       -CT right hip and thigh -Refer back to orthopedics (await CT results before placing order) -update Labs -pt is  given cafa/application for [facility] financial assistance -medication prescriptions sent to [facility] -pt has FP medicaid so she is to call gyn to update PAP -she will be referred for screening Mammogram -she will be referred for annual DM  Eye exam -pt scheduled to RTO in one month.  She is to contact office sooner prn worsening or new symptoms

## 2020-01-24 ENCOUNTER — Other Ambulatory Visit: Payer: Self-pay | Admitting: Student

## 2020-01-24 DIAGNOSIS — Z1239 Encounter for other screening for malignant neoplasm of breast: Secondary | ICD-10-CM

## 2020-01-30 ENCOUNTER — Encounter: Payer: Self-pay | Admitting: Physician Assistant

## 2020-02-15 ENCOUNTER — Ambulatory Visit: Payer: Medicaid Other | Admitting: Physician Assistant

## 2020-02-20 ENCOUNTER — Other Ambulatory Visit (HOSPITAL_COMMUNITY)
Admission: RE | Admit: 2020-02-20 | Discharge: 2020-02-20 | Disposition: A | Discharge status: Home / Self Care | Payer: Medicaid Other | Source: Ambulatory Visit | Attending: Physician Assistant | Admitting: Physician Assistant

## 2020-02-20 DIAGNOSIS — K219 Gastro-esophageal reflux disease without esophagitis: Secondary | ICD-10-CM | POA: Insufficient documentation

## 2020-02-20 DIAGNOSIS — I10 Essential (primary) hypertension: Secondary | ICD-10-CM | POA: Insufficient documentation

## 2020-02-20 DIAGNOSIS — R609 Edema, unspecified: Secondary | ICD-10-CM | POA: Insufficient documentation

## 2020-02-20 DIAGNOSIS — R52 Pain, unspecified: Secondary | ICD-10-CM | POA: Insufficient documentation

## 2020-02-20 DIAGNOSIS — Z96641 Presence of right artificial hip joint: Secondary | ICD-10-CM | POA: Insufficient documentation

## 2020-02-20 DIAGNOSIS — E119 Type 2 diabetes mellitus without complications: Secondary | ICD-10-CM | POA: Insufficient documentation

## 2020-02-20 LAB — COMPREHENSIVE METABOLIC PANEL
ALT: 25 U/L (ref 0–44)
AST: 23 U/L (ref 15–41)
Albumin: 4.4 g/dL (ref 3.5–5.0)
Alkaline Phosphatase: 61 U/L (ref 38–126)
Anion gap: 9 (ref 5–15)
BUN: 17 mg/dL (ref 6–20)
CO2: 27 mmol/L (ref 22–32)
Calcium: 9.7 mg/dL (ref 8.9–10.3)
Chloride: 104 mmol/L (ref 98–111)
Creatinine, Ser: 1.09 mg/dL — ABNORMAL HIGH (ref 0.44–1.00)
GFR, Estimated: >60 mL/min (ref >60)
Glucose, Bld: 99 mg/dL (ref 70–99)
Potassium: 3.8 mmol/L (ref 3.5–5.1)
Sodium: 140 mmol/L (ref 135–145)
Total Bilirubin: 0.2 mg/dL — ABNORMAL LOW (ref 0.3–1.2)
Total Protein: 7.9 g/dL (ref 6.5–8.1)

## 2020-02-20 LAB — CBC WITH DIFFERENTIAL/PLATELET
Abs Immature Granulocytes: 0.01 10*3/uL (ref 0.00–0.07)
Basophils Absolute: 0 10*3/uL (ref 0.0–0.1)
Basophils Relative: 1 %
Eosinophils Absolute: 0.3 10*3/uL (ref 0.0–0.5)
Eosinophils Relative: 6 %
HCT: 41.8 % (ref 36.0–46.0)
Hemoglobin: 13.3 g/dL (ref 12.0–15.0)
Immature Granulocytes: 0 %
Lymphocytes Relative: 52 %
Lymphs Abs: 2.8 10*3/uL (ref 0.7–4.0)
MCH: 30.4 pg (ref 26.0–34.0)
MCHC: 31.8 g/dL (ref 30.0–36.0)
MCV: 95.4 fL (ref 80.0–100.0)
Monocytes Absolute: 0.4 10*3/uL (ref 0.1–1.0)
Monocytes Relative: 7 %
Neutro Abs: 1.8 10*3/uL (ref 1.7–7.7)
Neutrophils Relative %: 34 %
Platelets: 219 10*3/uL (ref 150–400)
RBC: 4.38 MIL/uL (ref 3.87–5.11)
RDW: 12.5 % (ref 11.5–15.5)
WBC: 5.3 10*3/uL (ref 4.0–10.5)
nRBC: 0 % (ref 0.0–0.2)

## 2020-02-20 LAB — LIPID PANEL
Cholesterol: 207 mg/dL — ABNORMAL HIGH (ref 0–200)
HDL: 63 mg/dL (ref >40)
LDL Cholesterol: 128 mg/dL — ABNORMAL HIGH (ref 0–99)
Total CHOL/HDL Ratio: 3.3 RATIO
Triglycerides: 81 mg/dL (ref <150)
VLDL: 16 mg/dL (ref 0–40)

## 2020-02-21 LAB — HEMOGLOBIN A1C
Hgb A1c MFr Bld: 6.3 % — ABNORMAL HIGH (ref 4.8–5.6)
Mean Plasma Glucose: 134.11 mg/dL

## 2020-02-21 LAB — MICROALBUMIN, URINE: Microalb, Ur: 10 ug/mL — ABNORMAL HIGH

## 2020-02-22 ENCOUNTER — Encounter: Payer: Self-pay | Admitting: Physician Assistant

## 2020-02-22 ENCOUNTER — Ambulatory Visit: Payer: Medicaid Other | Admitting: Physician Assistant

## 2020-02-22 VITALS — BP 154/96 | HR 78 | Temp 96.3°F | Ht 64.5 in | Wt 250.4 lb

## 2020-02-22 DIAGNOSIS — M25551 Pain in right hip: Secondary | ICD-10-CM

## 2020-02-22 DIAGNOSIS — E119 Type 2 diabetes mellitus without complications: Secondary | ICD-10-CM

## 2020-02-22 DIAGNOSIS — E785 Hyperlipidemia, unspecified: Secondary | ICD-10-CM

## 2020-02-22 DIAGNOSIS — I10 Essential (primary) hypertension: Secondary | ICD-10-CM

## 2020-02-22 DIAGNOSIS — Z96641 Presence of right artificial hip joint: Secondary | ICD-10-CM

## 2020-02-22 DIAGNOSIS — N289 Disorder of kidney and ureter, unspecified: Secondary | ICD-10-CM

## 2020-02-22 MED ORDER — GABAPENTIN 100 MG PO CAPS: 100.0000 mg | ORAL_CAPSULE | Freq: Two times a day (BID) | ORAL | 1 refills | Status: DC

## 2020-02-22 MED ORDER — ATORVASTATIN CALCIUM 20 MG PO TABS: 20.0000 mg | ORAL_TABLET | Freq: Every day | ORAL | 1 refills | Status: DC

## 2020-02-22 MED ORDER — LOSARTAN POTASSIUM 100 MG PO TABS
100.0000 mg | ORAL_TABLET | Freq: Every day | ORAL | 1 refills | Status: DC
Start: 2020-02-22 — End: 2020-04-29

## 2020-02-22 NOTE — Progress Notes (Signed)
BP (!) 154/96   Pulse 78   Temp (!) 96.3 F (35.7 C)   Ht 5' 4.5" (1.638 m)   Wt 250 lb 6.4 oz (113.6 kg)   SpO2 97%   BMI 42.32 kg/m    Subjective:    Patient ID: Lisa [name], female    DOB: 02-13-1971, 49 y.o.   MRN: [id number]  HPI: Lisa [name] is a 49 y.o. female presenting on 02/22/2020 for Follow-up   HPI    Pt had a negative covid 19 screening questionnaire.   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   She took one dose of APAP but says it didn't help.  She says the flexeril didn't really help her hip pain..  CT of the hip was done.  She was referred to orthopedics but has not been scheduled yet (due to MD that did her hip surgery doesn't participate with [facility] and the MD that we are trying to get her in with does accept the [facility] but needs records from the first MD prior to scheduling).     Relevant past medical, surgical, family and social history reviewed and updated as indicated. Interim medical history since our last visit reviewed. Allergies and medications reviewed and updated.   Current Outpatient Medications:  .  citalopram (CELEXA) 10 MG tablet, Take 1 tablet (10 mg total) by mouth daily., Disp: 90 tablet, Rfl: 0 .  losartan (COZAAR) 50 MG tablet, Take 1 tablet (50 mg total) by mouth daily., Disp: 90 tablet, Rfl: 0 .  metFORMIN (GLUCOPHAGE) 500 MG tablet, Take 1 tablet (500 mg total) by mouth 2 (two) times daily with a meal., Disp: 180 tablet, Rfl: 0 .  omeprazole (PRILOSEC) 20 MG capsule, Take 1 capsule (20 mg total) by mouth daily. 30 days for gastroprotection while taking NSAIDs., Disp: 90 capsule, Rfl: 0 .  aspirin EC 325 MG tablet, Take 1 tablet (325 mg total) by mouth daily. For 30 days post op for DVT Prophylaxis (Patient not taking: No sig reported), Disp: 30 tablet, Rfl: 0 .  cyclobenzaprine (FLEXERIL) 10 MG tablet, Take 1 tablet (10 mg total) by mouth 3 (three) times daily as needed for muscle spasms. (Patient not taking:  Reported on 02/22/2020), Disp: 30 tablet, Rfl: 0 .  diclofenac (VOLTAREN) 75 MG EC tablet, Take 1 tablet (75 mg total) by mouth 2 (two) times daily. (Patient not taking: Reported on 02/22/2020), Disp: 30 tablet, Rfl: 0    Review of Systems  Per HPI unless specifically indicated above     Objective:    BP (!) 154/96   Pulse 78   Temp (!) 96.3 F (35.7 C)   Ht 5' 4.5" (1.638 m)   Wt 250 lb 6.4 oz (113.6 kg)   SpO2 97%   BMI 42.32 kg/m   Wt Readings from Last 3 Encounters:  02/22/20 250 lb 6.4 oz (113.6 kg)  01/18/20 252 lb (114.3 kg)  06/08/17 252 lb (114.3 kg)    Physical Exam Vitals reviewed.  Constitutional:      General: She is not in acute distress.    Appearance: She is well-developed and well-nourished. She is not toxic-appearing.  HENT:     Head: Normocephalic and atraumatic.  Cardiovascular:     Rate and Rhythm: Normal rate and regular rhythm.  Pulmonary:     Effort: Pulmonary effort is normal.     Breath sounds: Normal breath sounds.  Abdominal:     General: Bowel sounds  are normal.     Palpations: Abdomen is soft. There is no hepatosplenomegaly or mass.     Tenderness: There is no abdominal tenderness.  Musculoskeletal:        General: No edema.     Cervical back: Neck supple.     Right hip: Tenderness present. Decreased range of motion.     Right lower leg: No edema.     Left lower leg: No edema.  Lymphadenopathy:     Cervical: No cervical adenopathy.  Skin:    General: Skin is warm and dry.  Neurological:     Mental Status: She is alert and oriented to person, place, and time.  Psychiatric:        Mood and Affect: Mood and affect normal.        Behavior: Behavior normal.     Results for orders placed or performed during the hospital encounter of 02/20/20  CBC w/Diff/Platelet  Result Value Ref Range   WBC 5.3 4.0 - 10.5 K/uL   RBC 4.38 3.87 - 5.11 MIL/uL   Hemoglobin 13.3 12.0 - 15.0 g/dL   HCT 98.1 19.1 - 47.8 %   MCV 95.4 80.0 - 100.0 fL    MCH 30.4 26.0 - 34.0 pg   MCHC 31.8 30.0 - 36.0 g/dL   RDW 29.5 62.1 - 30.8 %   Platelets 219 150 - 400 K/uL   nRBC 0.0 0.0 - 0.2 %   Neutrophils Relative % 34 %   Neutro Abs 1.8 1.7 - 7.7 K/uL   Lymphocytes Relative 52 %   Lymphs Abs 2.8 0.7 - 4.0 K/uL   Monocytes Relative 7 %   Monocytes Absolute 0.4 0.1 - 1.0 K/uL   Eosinophils Relative 6 %   Eosinophils Absolute 0.3 0.0 - 0.5 K/uL   Basophils Relative 1 %   Basophils Absolute 0.0 0.0 - 0.1 K/uL   Immature Granulocytes 0 %   Abs Immature Granulocytes 0.01 0.00 - 0.07 K/uL  Comprehensive metabolic panel  Result Value Ref Range   Sodium 140 135 - 145 mmol/L   Potassium 3.8 3.5 - 5.1 mmol/L   Chloride 104 98 - 111 mmol/L   CO2 27 22 - 32 mmol/L   Glucose, Bld 99 70 - 99 mg/dL   BUN 17 6 - 20 mg/dL   Creatinine, Ser 6.57 (H) 0.44 - 1.00 mg/dL   Calcium 9.7 8.9 - 84.6 mg/dL   Total Protein 7.9 6.5 - 8.1 g/dL   Albumin 4.4 3.5 - 5.0 g/dL   AST 23 15 - 41 U/L   ALT 25 0 - 44 U/L   Alkaline Phosphatase 61 38 - 126 U/L   Total Bilirubin 0.2 (L) 0.3 - 1.2 mg/dL   GFR, Estimated >96 >29 mL/min   Anion gap 9 5 - 15  Lipid panel  Result Value Ref Range   Cholesterol 207 (H) 0 - 200 mg/dL   Triglycerides 81 <528 mg/dL   HDL 63 >41 mg/dL   Total CHOL/HDL Ratio 3.3 RATIO   VLDL 16 0 - 40 mg/dL   LDL Cholesterol 324 (H) 0 - 99 mg/dL  Hemoglobin M0N  Result Value Ref Range   Hgb A1c MFr Bld 6.3 (H) 4.8 - 5.6 %   Mean Plasma Glucose 134.11 mg/dL  Microalbumin, urine  Result Value Ref Range   Microalb, Ur 10.0 (H) Not Estab. ug/mL      Assessment & Plan:   Encounter Diagnoses  Name Primary?  . Essential hypertension Yes  .  Type 2 diabetes mellitus without complication, without long-term current use of insulin (HCC)   . Hyperlipidemia, unspecified hyperlipidemia type   . Right hip pain   . S/P total right hip arthroplasty   . Morbid obesity (HCC)   . Impaired renal function      -reviewed labs with pt -Add  atorvastatin for lipids -Increase losartan for bp -DM stable. Continue current metformin -Pt is on list for DM eye exam -She has appointment for mammogram -will have Nurse check on records from Dr Eulah Pont office so pt can get seen by [facility] for her hip pain -discussed mildly impaired renal function with pt.  Encouraged her to stay hydrated and limit nsaids.  She is given gabapentin to use for pain of the hip -pt to follow up 6 weeks to recheck BP.  She is to contact office sooner prn

## 2020-02-22 NOTE — Patient Instructions (Signed)
Chronic Kidney Disease, Adult Chronic kidney disease (CKD) happens when the kidneys are damaged over a long period of time. The kidneys are two organs that help with:  Getting rid of waste and extra fluid from the blood.  Making hormones that maintain the amount of fluid in your tissues and blood vessels.  Making sure that the body has the right amount of fluids and chemicals. Most of the time, CKD does not go away, but it can usually be controlled. Steps must be taken to slow down the kidney damage or to stop it from getting worse. If this is not done, the kidneys may stop working. Follow these instructions at home: Medicines  Take over-the-counter and prescription medicines only as told by your doctor. You may need to change the amount of medicines you take.  Do not take any new medicines unless your doctor says it is okay. Many medicines can make your kidney damage worse.  Do not take any vitamin and supplements unless your doctor says it is okay. Many vitamins and supplements can make your kidney damage worse. General instructions  Follow a diet as told by your doctor. You may need to stay away from: ? Alcohol. ? Salty foods. ? Foods that are high in:  Potassium.  Calcium.  Protein.  Do not use any products that contain nicotine or tobacco, such as cigarettes and e-cigarettes. If you need help quitting, ask your doctor.  Keep track of your blood pressure at home. Tell your doctor about any changes.  If you have diabetes, keep track of your blood sugar as told by your doctor.  Try to stay at a healthy weight. If you need help, ask your doctor.  Exercise at least 30 minutes a day, 5 days a week.  Stay up-to-date with your shots (immunizations) as told by your doctor.  Keep all follow-up visits as told by your doctor. This is important. Contact a doctor if:  Your symptoms get worse.  You have new symptoms. Get help right away if:  You have symptoms of end-stage  kidney disease. These may include: ? Headaches. ? Numbness in your hands or feet. ? Easy bruising. ? Having hiccups often. ? Chest pain. ? Shortness of breath. ? Stopping of menstrual periods in women.  You have a fever.  You have very little pee (urine).  You have pain or bleeding when you pee. Summary  Chronic kidney disease (CKD) happens when the kidneys are damaged over a long period of time.  Most of the time, this condition does not go away, but it can usually be controlled. Steps must be taken to slow down the kidney damage or to stop it from getting worse.  Treatment may include a combination of medicines and lifestyle changes. This information is not intended to replace advice given to you by your health care provider. Make sure you discuss any questions you have with your health care provider. Document Revised: 02/12/2017 Document Reviewed: 04/06/2016 Elsevier Patient Education  2020 Elsevier Inc.  

## 2020-02-29 ENCOUNTER — Ambulatory Visit (HOSPITAL_COMMUNITY)
Admission: RE | Admit: 2020-02-29 | Discharge: 2020-02-29 | Disposition: A | Discharge status: Home / Self Care | Payer: Self-pay | Source: Ambulatory Visit | Attending: Physician Assistant | Admitting: Physician Assistant

## 2020-02-29 ENCOUNTER — Encounter: Payer: Self-pay | Admitting: Physician Assistant

## 2020-02-29 ENCOUNTER — Other Ambulatory Visit: Payer: Self-pay

## 2020-02-29 DIAGNOSIS — Z1239 Encounter for other screening for malignant neoplasm of breast: Secondary | ICD-10-CM

## 2020-03-06 ENCOUNTER — Other Ambulatory Visit (HOSPITAL_COMMUNITY): Payer: Self-pay | Admitting: Physician Assistant

## 2020-03-06 DIAGNOSIS — R928 Other abnormal and inconclusive findings on diagnostic imaging of breast: Secondary | ICD-10-CM

## 2020-03-21 ENCOUNTER — Ambulatory Visit: Payer: Self-pay

## 2020-03-21 ENCOUNTER — Ambulatory Visit (INDEPENDENT_AMBULATORY_CARE_PROVIDER_SITE_OTHER): Payer: Self-pay | Admitting: Orthopaedic Surgery

## 2020-03-21 ENCOUNTER — Encounter: Payer: Self-pay | Admitting: Orthopaedic Surgery

## 2020-03-21 VITALS — Ht 64.5 in | Wt 243.8 lb

## 2020-03-21 DIAGNOSIS — M25551 Pain in right hip: Secondary | ICD-10-CM

## 2020-03-21 DIAGNOSIS — Z96641 Presence of right artificial hip joint: Secondary | ICD-10-CM

## 2020-03-21 DIAGNOSIS — Z96642 Presence of left artificial hip joint: Secondary | ICD-10-CM | POA: Insufficient documentation

## 2020-03-21 NOTE — Progress Notes (Addendum)
Office Visit Note   Patient: Lisa [name]           Date of Birth: 11/08/1970           MRN: [id number] Visit Date: 03/21/2020              Requested by: Lisa Dryer, PA-C [address] [place],  [place] [address] PCP: Lisa Dryer, PA-C   Assessment & Plan: Visit Diagnoses:  1. Pain in right hip   2. H/O total hip arthroplasty, left   3. History of total right hip arthroplasty     Plan: Painful right total of arthroplasty.  We will obtain a CBC with differential sed rate CRP.  We flipped her cane around she can use the cane in the left hand rather than right hand.  She has a walker at home I encouraged her to use this for a few days to see if this improves her symptoms. I will call her with lab work and will add results to bottom of dictation below when available from [facility]. She will call for appt with Dr.Tim Percell [name] who did her surgery for appt in their office for follow up.   Follow-Up Instructions: No follow-ups on file.   Orders:  Orders Placed This Encounter  Procedures   XR HIP UNILAT W OR W/O PELVIS 2-3 VIEWS RIGHT   Sed Rate (ESR)   C-reactive protein   CBC with Differential   No orders of the defined types were placed in this encounter.     Procedures: No procedures performed   Clinical Data: No additional findings.   Subjective: Chief Complaint  Patient presents with   Right Hip - Pain    HPI 50 year old female seen with painful right total hip arthroplasty x6 months using a cane in the right hand for painful right hip.  She had a left total hip by Dr. Alvan [name] 2015 and right total hip arthroplasty by Dr. Fredonia [name] in 2019.  Right hip did well up until 6 months ago suddenly became painful.  She is switched insurance and states that the reason she did switch doctors.  She went to the free clinic in [place] and now ends up at my office.  She has had a CT scan of her right femur which showed no evidence of loosening or subsidence.   No fever no chills no instability.  No associated back pain.  Previous lumbar MRI 2015 showed some minimal degenerative facet changes no stenosis.  Patient feels like her right hip just anterior to the incision pokes out and she thinks that there is a plate inside that is possibly raised and it swollen. Patient states she has not worked in 43 years. Review of Systems View of systems positive for diabetes last A1c's 2 years ago 6.50-monthago 6.3 good control.  Positive for hypertension, controlled.  Objective: Vital Signs: Ht 5' 4.5" (1.638 m)    Wt 243 lb 12.8 oz (110.6 kg)    BMI 41.20 kg/m   Physical Exam Constitutional:      Appearance: She is well-developed.  HENT:     Head: Normocephalic.     Right Ear: External ear normal.     Left Ear: External ear normal.  Eyes:     Pupils: Pupils are equal, round, and reactive to light.  Neck:     Thyroid: No thyromegaly.     Trachea: No tracheal deviation.  Cardiovascular:     Rate and Rhythm: Normal rate.  Pulmonary:     Effort: Pulmonary effort is normal.  Abdominal:     Palpations: Abdomen is soft.  Skin:    General: Skin is warm and dry.  Neurological:     Mental Status: She is alert and oriented to person, place, and time.  Psychiatric:        Mood and Affect: Mood and affect normal.        Behavior: Behavior normal.     Ortho Exam patient complains of extreme pain trying to walk she cannot walk without a 4-prong cane she has been using it in the right hand and wobbles with ambulation.  Lateral position she cannot abduct her hip due to pain.  Attempts at range of motion of her hip causes her to withdraw but does better with distraction.  She can lay on her right hip without problems and easily abduct her left hip without problems.  Recent CT scan showed intact abductors is of note.  Distal pulses intact negative Homan.No lower extremity swelling.  Area where she feels like she is swollen anterior incision is symmetrical with  the opposite side.  Both incisions well-healed no erythema no drainage.  No trochanteric bursal tenderness.  No sciatic notch tenderness.  No sensory deficit right or left lower extremity.  Specialty Comments:  No specialty comments available.  Imaging: No results found.   PMFS History: Patient Active Problem List   Diagnosis Date Noted   H/O total hip arthroplasty, left 03/21/2020   History of total right hip arthroplasty 03/21/2020   Pain in right hip 03/21/2020   Tobacco use disorder 05/17/2017   Diabetes (Lisa [name]) 05/17/2017   Essential hypertension 05/17/2017   Obese 03/07/2014   S/P left THA. AA 03/06/2014   Past Medical History:  Diagnosis Date   Anxiety    Arthritis    Left hip avascular necrosis   Asthma    Avascular necrosis (HCC) left hip   Depression    Diabetes mellitus without complication (HCC)    GERD (gastroesophageal reflux disease)    Hyperlipidemia    Hypertension    Neuromuscular disorder ([place])    muscle spasms    Family History  Problem Relation Age of Onset   Hypertension Mother    Diabetes Mother    Heart attack Father    Heart disease Father     Past Surgical History:  Procedure Laterality Date   ABDOMINAL HYSTERECTOMY     DECOMPRESSION HIP-CORE Left 10/09/2013   Procedure: DECOMPRESSION LEFT HIP - CORE;  Surgeon: Lisa Pole, MD;  Location: [facility];  Service: Orthopedics;  Laterality: Left;   HEMORRHOID SURGERY     TOTAL HIP ARTHROPLASTY Left 03/06/2014   Procedure: LEFT TOTAL HIP ARTHROPLASTY ANTERIOR APPROACH;  Surgeon: Lisa Pole, MD;  Location: [facility];  Service: Orthopedics;  Laterality: Left;   TOTAL HIP ARTHROPLASTY Right 06/08/2017   Procedure: TOTAL HIP ARTHROPLASTY ANTERIOR APPROACH;  Surgeon: Lisa Butters, MD;  Location: [facility];  Service: Orthopedics;  Laterality: Right;   Social History   Occupational History   Not on file  Tobacco Use   Smoking status: Former Smoker    Packs/day: 0.25     Years: 1.00    Pack years: 0.25    Types: Cigarettes    Quit date: 03/16/2017    Years since quitting: 3.0   Smokeless tobacco: Never Used  Vaping Use   Vaping Use: Never used  Substance and Sexual Activity   Alcohol use: No   Drug  use: No   Sexual activity: Yes    LAB RESULTS FROM [facility] CRP 23.0  NORMAL LESS THAN 10.0MG/L SED RATE 34  NORMAL 0-20.  WBC 5.0 NORMAL DIFF.

## 2020-03-28 ENCOUNTER — Inpatient Hospital Stay (HOSPITAL_COMMUNITY): Admission: RE | Admit: 2020-03-28 | Payer: Medicaid Other | Source: Ambulatory Visit

## 2020-03-28 ENCOUNTER — Encounter (HOSPITAL_COMMUNITY): Payer: Self-pay

## 2020-03-28 ENCOUNTER — Ambulatory Visit (HOSPITAL_COMMUNITY): Admission: RE | Admit: 2020-03-28 | Payer: Self-pay | Source: Ambulatory Visit

## 2020-04-09 ENCOUNTER — Other Ambulatory Visit: Payer: Self-pay | Admitting: Physician Assistant

## 2020-04-09 ENCOUNTER — Ambulatory Visit: Payer: Medicaid Other | Admitting: Physician Assistant

## 2020-04-22 ENCOUNTER — Other Ambulatory Visit: Payer: Self-pay | Admitting: Physician Assistant

## 2020-04-23 ENCOUNTER — Telehealth: Payer: Self-pay

## 2020-04-23 NOTE — Telephone Encounter (Signed)
Returned call from client.  No answer, left voicemail requesting client to return call to [facility].   Francee Nodal RN [employer]

## 2020-04-29 ENCOUNTER — Other Ambulatory Visit: Payer: Self-pay

## 2020-04-29 ENCOUNTER — Ambulatory Visit: Payer: Medicaid Other | Admitting: Physician Assistant

## 2020-04-29 ENCOUNTER — Encounter: Payer: Self-pay | Admitting: Physician Assistant

## 2020-04-29 ENCOUNTER — Telehealth: Payer: Self-pay | Admitting: Orthopaedic Surgery

## 2020-04-29 VITALS — BP 172/117 | HR 77 | Temp 98.0°F

## 2020-04-29 DIAGNOSIS — Z9119 Patient's noncompliance with other medical treatment and regimen: Secondary | ICD-10-CM

## 2020-04-29 DIAGNOSIS — Z91199 Patient's noncompliance with other medical treatment and regimen due to unspecified reason: Secondary | ICD-10-CM

## 2020-04-29 DIAGNOSIS — I10 Essential (primary) hypertension: Secondary | ICD-10-CM

## 2020-04-29 MED ORDER — OMEPRAZOLE 20 MG PO CPDR: 20.0000 mg | DELAYED_RELEASE_CAPSULE | Freq: Every day | ORAL | 0 refills | Status: DC

## 2020-04-29 MED ORDER — LISINOPRIL 20 MG PO TABS: 20.0000 mg | ORAL_TABLET | Freq: Every day | ORAL | 1 refills | Status: DC

## 2020-04-29 MED ORDER — LISINOPRIL 20 MG PO TABS: 20.0000 mg | ORAL_TABLET | Freq: Every day | ORAL | 0 refills | Status: DC

## 2020-04-29 MED ORDER — METFORMIN HCL 500 MG PO TABS: 500.0000 mg | ORAL_TABLET | Freq: Two times a day (BID) | ORAL | 0 refills | Status: DC

## 2020-04-29 MED ORDER — CITALOPRAM HYDROBROMIDE 10 MG PO TABS: 10.0000 mg | ORAL_TABLET | Freq: Every day | ORAL | 0 refills | Status: DC

## 2020-04-29 MED ORDER — ATORVASTATIN CALCIUM 20 MG PO TABS: 20.0000 mg | ORAL_TABLET | Freq: Every day | ORAL | 0 refills | Status: DC

## 2020-04-29 MED ORDER — GABAPENTIN 100 MG PO CAPS: 100.0000 mg | ORAL_CAPSULE | Freq: Two times a day (BID) | ORAL | 0 refills | Status: DC | PRN

## 2020-04-29 MED ORDER — DICLOFENAC-MISOPROSTOL 75-0.2 MG PO TBEC: 1.0000 | DELAYED_RELEASE_TABLET | Freq: Two times a day (BID) | ORAL | 0 refills | Status: DC | PRN

## 2020-04-29 NOTE — Telephone Encounter (Signed)
Ok for this? 

## 2020-04-29 NOTE — Telephone Encounter (Signed)
Pt primary care physician called and was wonder where this pt should be seen because Dr.Tim Eulah Pont (the one who did her surgery)does not take the [facility] discount and she doesn't have any insurance. Please give her a call with some suggestions. She would like to continue to see Dr.Yates if she could. CB [phone].

## 2020-04-29 NOTE — Progress Notes (Signed)
BP (!) 172/117   Pulse 77   Temp 98 F (36.7 C)   SpO2 99%    Subjective:    Patient ID: Lisa [name], female    DOB: July 09, 1970, 50 y.o.   MRN: [id number]  HPI: Lisa [name] is a 50 y.o. female presenting on 04/29/2020 for No chief complaint on file.   HPI   Pt had a negative covid 19 screening questionnaire.     Pt is 49yoF with appointment to follow up HTN.  Pt says She is "out of everything".  She was a no-show to her appt on 1/25.     She says she never got the diclofenac.  [facility]ist only has arthortec so will need to send it that way   Relevant past medical, surgical, family and social history reviewed and updated as indicated. Interim medical history since our last visit reviewed. Allergies and medications reviewed and updated.  CURRENT MEDS: Metformin 1g bid Asa 325mg  qd    Current Outpatient Medications:  .  metFORMIN (GLUCOPHAGE) 500 MG tablet, Take 1 tablet (500 mg total) by mouth 2 (two) times daily with a meal., Disp: 180 tablet, Rfl: 0 .  aspirin EC 325 MG tablet, Take 1 tablet (325 mg total) by mouth daily. For 30 days post op for DVT Prophylaxis (Patient not taking: No sig reported), Disp: 30 tablet, Rfl: 0 .  atorvastatin (LIPITOR) 20 MG tablet, Take 1 tablet (20 mg total) by mouth daily., Disp: 90 tablet, Rfl: 1 .  citalopram (CELEXA) 10 MG tablet, Take 1 tablet (10 mg total) by mouth daily., Disp: 90 tablet, Rfl: 0 .  cyclobenzaprine (FLEXERIL) 10 MG tablet, Take 1 tablet (10 mg total) by mouth 3 (three) times daily as needed for muscle spasms. (Patient not taking: No sig reported), Disp: 30 tablet, Rfl: 0 .  diclofenac (VOLTAREN) 75 MG EC tablet, Take 1 tablet (75 mg total) by mouth 2 (two) times daily. (Patient not taking: No sig reported), Disp: 30 tablet, Rfl: 0 .  gabapentin (NEURONTIN) 100 MG capsule, Take 1 capsule (100 mg total) by mouth 2 (two) times daily., Disp: 60 capsule, Rfl: 1 .  losartan (COZAAR) 100 MG tablet, Take 1 tablet (100  mg total) by mouth daily., Disp: 90 tablet, Rfl: 1 .  omeprazole (PRILOSEC) 20 MG capsule, Take 1 capsule (20 mg total) by mouth daily. 30 days for gastroprotection while taking NSAIDs., Disp: 90 capsule, Rfl: 0    Review of Systems  Per HPI unless specifically indicated above     Objective:    BP (!) 172/117   Pulse 77   Temp 98 F (36.7 C)   SpO2 99%   Wt Readings from Last 3 Encounters:  03/21/20 243 lb 12.8 oz (110.6 kg)  02/22/20 250 lb 6.4 oz (113.6 kg)  01/18/20 252 lb (114.3 kg)    Physical Exam Constitutional:      General: She is not in acute distress.    Appearance: She is obese. She is not toxic-appearing.  HENT:     Head: Normocephalic and atraumatic.  Cardiovascular:     Rate and Rhythm: Normal rate and regular rhythm.  Pulmonary:     Effort: No respiratory distress.     Breath sounds: Normal breath sounds. No wheezing or rhonchi.  Musculoskeletal:     Right lower leg: No edema.     Left lower leg: No edema.  Neurological:     Mental Status: She is alert and oriented to person, place,  and time.  Psychiatric:        Attention and Perception: Attention normal.        Behavior: Behavior is cooperative.           Assessment & Plan:     Encounter Diagnoses  Name Primary?  . Essential hypertension Yes  . Personal history of noncompliance with medical treatment, presenting hazards to health      We will call Dr Ophelia Charter' office about sending pt back to dr Eulah Pont- she doesn't have the $$  Will rx lisinopril since losartan on back order  Pt is counseled on need to avoid running out of her medications, particularly in light of her history of cva  Pt to follow up 1 months.  She is to contact office sooner prn

## 2020-04-30 NOTE — Telephone Encounter (Signed)
OK . Will need ROV to see and discuss getting bone scan

## 2020-04-30 NOTE — Telephone Encounter (Signed)
Patient has family planning medicaid only. She is currently the [employer]. Patients PCP is wanting to know if you are agreeable to treating patient since [facility] does not accept [employer] patients.

## 2020-04-30 NOTE — Telephone Encounter (Signed)
See below. He is ok to see patient.

## 2020-04-30 NOTE — Telephone Encounter (Signed)
Looks like from [employer] she has medicaid.

## 2020-05-14 ENCOUNTER — Other Ambulatory Visit: Payer: Self-pay | Admitting: Physician Assistant

## 2020-05-14 DIAGNOSIS — E785 Hyperlipidemia, unspecified: Secondary | ICD-10-CM

## 2020-05-14 DIAGNOSIS — E119 Type 2 diabetes mellitus without complications: Secondary | ICD-10-CM

## 2020-05-14 DIAGNOSIS — I10 Essential (primary) hypertension: Secondary | ICD-10-CM

## 2020-05-24 ENCOUNTER — Other Ambulatory Visit: Payer: Self-pay

## 2020-05-24 ENCOUNTER — Other Ambulatory Visit (HOSPITAL_COMMUNITY)
Admission: RE | Admit: 2020-05-24 | Discharge: 2020-05-24 | Disposition: A | Discharge status: Home / Self Care | Payer: Self-pay | Source: Ambulatory Visit | Attending: Physician Assistant | Admitting: Physician Assistant

## 2020-05-24 DIAGNOSIS — I10 Essential (primary) hypertension: Secondary | ICD-10-CM | POA: Insufficient documentation

## 2020-05-24 DIAGNOSIS — E785 Hyperlipidemia, unspecified: Secondary | ICD-10-CM | POA: Insufficient documentation

## 2020-05-24 DIAGNOSIS — E119 Type 2 diabetes mellitus without complications: Secondary | ICD-10-CM | POA: Insufficient documentation

## 2020-05-24 LAB — HEMOGLOBIN A1C
Hgb A1c MFr Bld: 6.9 % — ABNORMAL HIGH (ref 4.8–5.6)
Mean Plasma Glucose: 151.33 mg/dL

## 2020-05-24 LAB — COMPREHENSIVE METABOLIC PANEL
ALT: 35 U/L (ref 0–44)
AST: 32 U/L (ref 15–41)
Albumin: 4.1 g/dL (ref 3.5–5.0)
Alkaline Phosphatase: 102 U/L (ref 38–126)
Anion gap: 9 (ref 5–15)
BUN: 16 mg/dL (ref 6–20)
CO2: 27 mmol/L (ref 22–32)
Calcium: 9.5 mg/dL (ref 8.9–10.3)
Chloride: 104 mmol/L (ref 98–111)
Creatinine, Ser: 1.06 mg/dL — ABNORMAL HIGH (ref 0.44–1.00)
GFR, Estimated: >60 mL/min (ref >60)
Glucose, Bld: 92 mg/dL (ref 70–99)
Potassium: 3.9 mmol/L (ref 3.5–5.1)
Sodium: 140 mmol/L (ref 135–145)
Total Bilirubin: 0.7 mg/dL (ref 0.3–1.2)
Total Protein: 7.6 g/dL (ref 6.5–8.1)

## 2020-05-24 LAB — LIPID PANEL
Cholesterol: 147 mg/dL (ref 0–200)
HDL: 58 mg/dL (ref >40)
LDL Cholesterol: 69 mg/dL (ref 0–99)
Total CHOL/HDL Ratio: 2.5 RATIO
Triglycerides: 100 mg/dL (ref <150)
VLDL: 20 mg/dL (ref 0–40)

## 2020-05-27 ENCOUNTER — Ambulatory Visit: Payer: Medicaid Other | Admitting: Physician Assistant

## 2020-05-27 ENCOUNTER — Encounter: Payer: Self-pay | Admitting: Physician Assistant

## 2020-05-27 ENCOUNTER — Other Ambulatory Visit: Payer: Self-pay

## 2020-05-27 VITALS — BP 162/98 | HR 69 | Temp 97.3°F | Wt 247.5 lb

## 2020-05-27 DIAGNOSIS — N289 Disorder of kidney and ureter, unspecified: Secondary | ICD-10-CM

## 2020-05-27 DIAGNOSIS — M25551 Pain in right hip: Secondary | ICD-10-CM

## 2020-05-27 DIAGNOSIS — E119 Type 2 diabetes mellitus without complications: Secondary | ICD-10-CM

## 2020-05-27 DIAGNOSIS — I10 Essential (primary) hypertension: Secondary | ICD-10-CM

## 2020-05-27 DIAGNOSIS — E785 Hyperlipidemia, unspecified: Secondary | ICD-10-CM

## 2020-05-27 MED ORDER — AMLODIPINE BESYLATE 10 MG PO TABS: 10.0000 mg | ORAL_TABLET | Freq: Every day | ORAL | 0 refills | Status: DC

## 2020-05-27 NOTE — Patient Instructions (Signed)
Contact [facility] about your [facility] financial assistance application

## 2020-05-27 NOTE — Progress Notes (Signed)
BP (!) 162/98   Pulse 69   Temp (!) 97.3 F (36.3 C)   Wt 247 lb 8 oz (112.3 kg)   SpO2 94%   BMI 41.83 kg/m    Subjective:    Patient ID: Lisa [name], female    DOB: 02-25-1971, 50 y.o.   MRN: [id number]  HPI: Lisa [name] is a 50 y.o. female presenting on 05/27/2020 for Hypertension   HPI      Pt had a negative covid 19 screening questionnaire.     Chief Complaint  Patient presents with  . Hypertension     Pt is feeling fine this morning.  Her hip is still hurting.  She has still got gotten [facility] [employer] [facility] application submitted so she can go to orthopedics about her hip.   Pt was a no-show to her diagnostic mammogram appointment in January.     Relevant past medical, surgical, family and social history reviewed and updated as indicated. Interim medical history since our last visit reviewed. Allergies and medications reviewed and updated.   Current Outpatient Medications:  .  aspirin EC 325 MG tablet, Take 1 tablet (325 mg total) by mouth daily. For 30 days post op for DVT Prophylaxis, Disp: 30 tablet, Rfl: 0 .  atorvastatin (LIPITOR) 20 MG tablet, Take 1 tablet (20 mg total) by mouth daily., Disp: 90 tablet, Rfl: 0 .  citalopram (CELEXA) 10 MG tablet, Take 1 tablet (10 mg total) by mouth daily., Disp: 90 tablet, Rfl: 0 .  Diclofenac-miSOPROStol 75-0.2 MG TBEC, Take 1 capsule by mouth 2 (two) times daily as needed., Disp: 60 tablet, Rfl: 0 .  gabapentin (NEURONTIN) 100 MG capsule, Take 1 capsule (100 mg total) by mouth 2 (two) times daily as needed., Disp: 60 capsule, Rfl: 0 .  lisinopril (ZESTRIL) 20 MG tablet, Take 1 tablet (20 mg total) by mouth daily., Disp: 90 tablet, Rfl: 0 .  metFORMIN (GLUCOPHAGE) 500 MG tablet, Take 1 tablet (500 mg total) by mouth 2 (two) times daily with a meal., Disp: 180 tablet, Rfl: 0 .  omeprazole (PRILOSEC) 20 MG capsule, Take 1 capsule (20 mg total) by mouth daily. 30 days for gastroprotection while taking NSAIDs.,  Disp: 90 capsule, Rfl: 0     Review of Systems  Per HPI unless specifically indicated above     Objective:    BP (!) 162/98   Pulse 69   Temp (!) 97.3 F (36.3 C)   Wt 247 lb 8 oz (112.3 kg)   SpO2 94%   BMI 41.83 kg/m   Wt Readings from Last 3 Encounters:  05/27/20 247 lb 8 oz (112.3 kg)  03/21/20 243 lb 12.8 oz (110.6 kg)  02/22/20 250 lb 6.4 oz (113.6 kg)    Physical Exam Constitutional:      General: She is not in acute distress.    Appearance: She is not toxic-appearing.  HENT:     Head: Normocephalic and atraumatic.  Cardiovascular:     Rate and Rhythm: Normal rate and regular rhythm.  Pulmonary:     Effort: Pulmonary effort is normal. No respiratory distress.     Breath sounds: Normal breath sounds. No wheezing or rhonchi.  Musculoskeletal:     Cervical back: Neck supple.     Right lower leg: No edema.     Left lower leg: No edema.  Lymphadenopathy:     Cervical: No cervical adenopathy.  Neurological:     Mental Status: She is alert and oriented to  person, place, and time.  Psychiatric:        Behavior: Behavior normal. Behavior is cooperative.     Results for orders placed or performed during the hospital encounter of 05/24/20  Hemoglobin A1c  Result Value Ref Range   Hgb A1c MFr Bld 6.9 (H) 4.8 - 5.6 %   Mean Plasma Glucose 151.33 mg/dL  Lipid panel  Result Value Ref Range   Cholesterol 147 0 - 200 mg/dL   Triglycerides 250 <539 mg/dL   HDL 58 >76 mg/dL   Total CHOL/HDL Ratio 2.5 RATIO   VLDL 20 0 - 40 mg/dL   LDL Cholesterol 69 0 - 99 mg/dL  Comprehensive metabolic panel  Result Value Ref Range   Sodium 140 135 - 145 mmol/L   Potassium 3.9 3.5 - 5.1 mmol/L   Chloride 104 98 - 111 mmol/L   CO2 27 22 - 32 mmol/L   Glucose, Bld 92 70 - 99 mg/dL   BUN 16 6 - 20 mg/dL   Creatinine, Ser 7.34 (H) 0.44 - 1.00 mg/dL   Calcium 9.5 8.9 - 19.3 mg/dL   Total Protein 7.6 6.5 - 8.1 g/dL   Albumin 4.1 3.5 - 5.0 g/dL   AST 32 15 - 41 U/L   ALT 35 0  - 44 U/L   Alkaline Phosphatase 102 38 - 126 U/L   Total Bilirubin 0.7 0.3 - 1.2 mg/dL   GFR, Estimated >79 >02 mL/min   Anion gap 9 5 - 15      Assessment & Plan:    Encounter Diagnoses  Name Primary?  . Essential hypertension Yes  . Type 2 diabetes mellitus without complication, without long-term current use of insulin (HCC)   . Hyperlipidemia, unspecified hyperlipidemia type   . Right hip pain   . Impaired renal function   . Morbid obesity (HCC)      -reviewed labs with pt  -pt to Continue current medications -Add amlodipine for bp -Pt to call [facility] to get assistance with cafa -pt to call to reschedule the diagnostic mammogram appointment she missed in January -pt to follow up here 1 month to recheck bp.  She is to contact office sooner prn

## 2020-05-31 ENCOUNTER — Other Ambulatory Visit: Payer: Self-pay | Admitting: Physician Assistant

## 2020-06-18 ENCOUNTER — Ambulatory Visit (HOSPITAL_COMMUNITY)
Admission: RE | Admit: 2020-06-18 | Discharge: 2020-06-18 | Disposition: A | Discharge status: Home / Self Care | Payer: Self-pay | Source: Ambulatory Visit | Attending: Physician Assistant | Admitting: Physician Assistant

## 2020-06-18 ENCOUNTER — Other Ambulatory Visit: Payer: Self-pay

## 2020-06-18 DIAGNOSIS — R928 Other abnormal and inconclusive findings on diagnostic imaging of breast: Secondary | ICD-10-CM | POA: Insufficient documentation

## 2020-06-27 ENCOUNTER — Ambulatory Visit: Payer: Medicaid Other | Admitting: Physician Assistant

## 2020-06-27 ENCOUNTER — Encounter: Payer: Self-pay | Admitting: Physician Assistant

## 2020-06-27 ENCOUNTER — Other Ambulatory Visit: Payer: Self-pay

## 2020-06-27 VITALS — BP 117/78 | HR 66 | Temp 97.9°F

## 2020-06-27 DIAGNOSIS — I10 Essential (primary) hypertension: Secondary | ICD-10-CM

## 2020-06-27 MED ORDER — DICLOFENAC-MISOPROSTOL 75-0.2 MG PO TBEC: 1.0000 | DELAYED_RELEASE_TABLET | Freq: Two times a day (BID) | ORAL | 0 refills | Status: DC | PRN

## 2020-06-27 MED ORDER — CITALOPRAM HYDROBROMIDE 10 MG PO TABS: 10.0000 mg | ORAL_TABLET | Freq: Every day | ORAL | 0 refills | Status: DC

## 2020-06-27 MED ORDER — LISINOPRIL 20 MG PO TABS: 20.0000 mg | ORAL_TABLET | Freq: Every day | ORAL | 0 refills | Status: DC

## 2020-06-27 MED ORDER — AMLODIPINE BESYLATE 10 MG PO TABS: 10.0000 mg | ORAL_TABLET | Freq: Every day | ORAL | 0 refills | Status: DC

## 2020-06-27 MED ORDER — ATORVASTATIN CALCIUM 20 MG PO TABS: 20.0000 mg | ORAL_TABLET | Freq: Every day | ORAL | 0 refills | Status: DC

## 2020-06-27 MED ORDER — OMEPRAZOLE 20 MG PO CPDR: 20.0000 mg | DELAYED_RELEASE_CAPSULE | Freq: Every day | ORAL | 0 refills | Status: DC

## 2020-06-27 MED ORDER — METFORMIN HCL 500 MG PO TABS: 500.0000 mg | ORAL_TABLET | Freq: Two times a day (BID) | ORAL | 0 refills | Status: DC

## 2020-06-27 NOTE — Progress Notes (Signed)
BP 117/78   Pulse 66   Temp 97.9 F (36.6 C)   SpO2 95%    Subjective:    Patient ID: Lisa [name], female    DOB: 1970-09-11, 50 y.o.   MRN: [id number]  HPI: Lisa [name] is a 50 y.o. female presenting on 06/27/2020 for No chief complaint on file.   HPI    Pt had a negative covid 19 screening questionnaire.     Pt has appointt today to check htn.  Amlodipine was added last month.  She has still not gotten cafa submitted  She got her diagnostic mammogram done 06/18/20 and that was normal    Relevant past medical, surgical, family and social history reviewed and updated as indicated. Interim medical history since our last visit reviewed. Allergies and medications reviewed and updated.   Current Outpatient Medications:  .  amLODipine (NORVASC) 10 MG tablet, Take 1 tablet (10 mg total) by mouth daily., Disp: 90 tablet, Rfl: 0 .  aspirin EC 325 MG tablet, Take 1 tablet (325 mg total) by mouth daily. For 30 days post op for DVT Prophylaxis, Disp: 30 tablet, Rfl: 0 .  atorvastatin (LIPITOR) 20 MG tablet, Take 1 tablet (20 mg total) by mouth daily., Disp: 90 tablet, Rfl: 0 .  citalopram (CELEXA) 10 MG tablet, Take 1 tablet (10 mg total) by mouth daily., Disp: 90 tablet, Rfl: 0 .  Diclofenac-miSOPROStol 75-0.2 MG TBEC, Take 1 capsule by mouth 2 (two) times daily as needed., Disp: 60 tablet, Rfl: 0 .  gabapentin (NEURONTIN) 100 MG capsule, TAKE 1 CAPSULE BY MOUTH TWICE DAILY AS NEEDED, Disp: 60 capsule, Rfl: 0 .  lisinopril (ZESTRIL) 20 MG tablet, Take 1 tablet (20 mg total) by mouth daily., Disp: 90 tablet, Rfl: 0 .  metFORMIN (GLUCOPHAGE) 500 MG tablet, Take 1 tablet (500 mg total) by mouth 2 (two) times daily with a meal., Disp: 180 tablet, Rfl: 0 .  omeprazole (PRILOSEC) 20 MG capsule, Take 1 capsule (20 mg total) by mouth daily. 30 days for gastroprotection while taking NSAIDs., Disp: 90 capsule, Rfl: 0    Review of Systems  Per HPI unless specifically indicated  above     Objective:    BP 117/78   Pulse 66   Temp 97.9 F (36.6 C)   SpO2 95%   Wt Readings from Last 3 Encounters:  05/27/20 247 lb 8 oz (112.3 kg)  03/21/20 243 lb 12.8 oz (110.6 kg)  02/22/20 250 lb 6.4 oz (113.6 kg)    Physical Exam Vitals reviewed.  Constitutional:      Appearance: She is well-developed.  HENT:     Head: Normocephalic and atraumatic.  Cardiovascular:     Rate and Rhythm: Normal rate and regular rhythm.  Pulmonary:     Effort: Pulmonary effort is normal.     Breath sounds: Normal breath sounds.  Abdominal:     General: Bowel sounds are normal.     Palpations: Abdomen is soft. There is no mass.     Tenderness: There is no abdominal tenderness.  Musculoskeletal:     Cervical back: Neck supple.     Right lower leg: No edema.     Left lower leg: No edema.  Lymphadenopathy:     Cervical: No cervical adenopathy.  Skin:    General: Skin is warm and dry.  Neurological:     Mental Status: She is alert and oriented to person, place, and time.  Psychiatric:  Behavior: Behavior normal.          Assessment & Plan:   Encounter Diagnosis  Name Primary?  . Essential hypertension Yes     -pt encouraged to Get cafa / [facility] [employer] financial application submitted -she is to Continue current rx -Pt was notified of dm eye exam .  She can't do April 21 so will see about switching her appointment -pt to follow up 3 months.  She is to contact office sooner prn

## 2020-07-06 ENCOUNTER — Other Ambulatory Visit: Payer: Self-pay | Admitting: Physician Assistant

## 2020-07-14 ENCOUNTER — Other Ambulatory Visit: Payer: Self-pay | Admitting: Physician Assistant

## 2020-09-04 ENCOUNTER — Other Ambulatory Visit: Payer: Self-pay | Admitting: Physician Assistant

## 2020-09-11 ENCOUNTER — Other Ambulatory Visit: Payer: Self-pay | Admitting: Physician Assistant

## 2020-09-11 DIAGNOSIS — E119 Type 2 diabetes mellitus without complications: Secondary | ICD-10-CM

## 2020-09-11 DIAGNOSIS — E785 Hyperlipidemia, unspecified: Secondary | ICD-10-CM

## 2020-09-11 DIAGNOSIS — I10 Essential (primary) hypertension: Secondary | ICD-10-CM

## 2020-09-25 ENCOUNTER — Other Ambulatory Visit: Payer: Self-pay

## 2020-09-25 ENCOUNTER — Ambulatory Visit: Payer: Medicaid Other | Admitting: Physician Assistant

## 2020-09-25 ENCOUNTER — Other Ambulatory Visit (HOSPITAL_COMMUNITY)
Admission: RE | Admit: 2020-09-25 | Discharge: 2020-09-25 | Disposition: A | Discharge status: Home / Self Care | Payer: Medicaid Other | Source: Ambulatory Visit | Attending: Physician Assistant | Admitting: Physician Assistant

## 2020-09-25 ENCOUNTER — Encounter: Payer: Self-pay | Admitting: Physician Assistant

## 2020-09-25 VITALS — BP 126/90 | HR 59 | Temp 98.1°F | Wt 234.0 lb

## 2020-09-25 DIAGNOSIS — E119 Type 2 diabetes mellitus without complications: Secondary | ICD-10-CM

## 2020-09-25 DIAGNOSIS — I10 Essential (primary) hypertension: Secondary | ICD-10-CM | POA: Insufficient documentation

## 2020-09-25 DIAGNOSIS — E669 Obesity, unspecified: Secondary | ICD-10-CM

## 2020-09-25 DIAGNOSIS — E785 Hyperlipidemia, unspecified: Secondary | ICD-10-CM | POA: Insufficient documentation

## 2020-09-25 DIAGNOSIS — N289 Disorder of kidney and ureter, unspecified: Secondary | ICD-10-CM

## 2020-09-25 DIAGNOSIS — M25551 Pain in right hip: Secondary | ICD-10-CM

## 2020-09-25 LAB — COMPREHENSIVE METABOLIC PANEL
ALT: 18 U/L (ref 0–44)
AST: 21 U/L (ref 15–41)
Albumin: 3.8 g/dL (ref 3.5–5.0)
Alkaline Phosphatase: 68 U/L (ref 38–126)
Anion gap: 7 (ref 5–15)
BUN: 13 mg/dL (ref 6–20)
CO2: 29 mmol/L (ref 22–32)
Calcium: 9.3 mg/dL (ref 8.9–10.3)
Chloride: 106 mmol/L (ref 98–111)
Creatinine, Ser: 0.99 mg/dL (ref 0.44–1.00)
GFR, Estimated: >60 mL/min (ref >60)
Glucose, Bld: 112 mg/dL — ABNORMAL HIGH (ref 70–99)
Potassium: 3.5 mmol/L (ref 3.5–5.1)
Sodium: 142 mmol/L (ref 135–145)
Total Bilirubin: 0.5 mg/dL (ref 0.3–1.2)
Total Protein: 6.6 g/dL (ref 6.5–8.1)

## 2020-09-25 LAB — LIPID PANEL
Cholesterol: 156 mg/dL (ref 0–200)
HDL: 60 mg/dL (ref >40)
LDL Cholesterol: 81 mg/dL (ref 0–99)
Total CHOL/HDL Ratio: 2.6 RATIO
Triglycerides: 73 mg/dL (ref <150)
VLDL: 15 mg/dL (ref 0–40)

## 2020-09-25 LAB — HEMOGLOBIN A1C
Hgb A1c MFr Bld: 6.5 % — ABNORMAL HIGH (ref 4.8–5.6)
Mean Plasma Glucose: 139.85 mg/dL

## 2020-09-25 MED ORDER — GABAPENTIN 100 MG PO CAPS: ORAL_CAPSULE | ORAL | 0 refills | Status: AC

## 2020-09-25 MED ORDER — CITALOPRAM HYDROBROMIDE 10 MG PO TABS: 10.0000 mg | ORAL_TABLET | Freq: Every day | ORAL | 0 refills | Status: DC

## 2020-09-25 MED ORDER — LISINOPRIL 20 MG PO TABS: 20.0000 mg | ORAL_TABLET | Freq: Every day | ORAL | 0 refills | Status: DC

## 2020-09-25 NOTE — Progress Notes (Signed)
BP 126/90   Pulse (!) 59   Temp 98.1 F (36.7 C)   Wt 234 lb (106.1 kg)   SpO2 99%   BMI 39.55 kg/m    Subjective:    Patient ID: Lisa [name], female    DOB: May 08, 1970, 50 y.o.   MRN: [id number]  HPI: ALANDRIA BUTKIEWICZ is a 50 y.o. female presenting on 09/25/2020 for Diabetes, Hypertension, and Hyperlipidemia   HPI    Pt had a negative covid 19 screening questionnaire.   Chief Complaint  Patient presents with   Diabetes   Hypertension   Hyperlipidemia     She has still not submitted her cafa.   She was given it in November so she can see specialist about her hip pain.  Pt did not get her labs drawn as requested.  Pt says she has no new issues.     Relevant past medical, surgical, family and social history reviewed and updated as indicated. Interim medical history since our last visit reviewed. Allergies and medications reviewed and updated.    Current Outpatient Medications:    amLODipine (NORVASC) 10 MG tablet, Take 1 tablet (10 mg total) by mouth daily., Disp: 90 tablet, Rfl: 0   aspirin EC 325 MG tablet, Take 1 tablet (325 mg total) by mouth daily. For 30 days post op for DVT Prophylaxis, Disp: 30 tablet, Rfl: 0   atorvastatin (LIPITOR) 20 MG tablet, Take 1 tablet (20 mg total) by mouth daily., Disp: 90 tablet, Rfl: 0   Diclofenac-miSOPROStol 75-0.2 MG TBEC, Take 1 capsule by mouth 2 (two) times daily as needed., Disp: 180 tablet, Rfl: 0   metFORMIN (GLUCOPHAGE) 500 MG tablet, Take 1 tablet (500 mg total) by mouth 2 (two) times daily with a meal., Disp: 180 tablet, Rfl: 0   omeprazole (PRILOSEC) 20 MG capsule, Take 1 capsule (20 mg total) by mouth daily. 30 days for gastroprotection while taking NSAIDs., Disp: 90 capsule, Rfl: 0   citalopram (CELEXA) 10 MG tablet, Take 1 tablet (10 mg total) by mouth daily. (Patient not taking: Reported on 09/25/2020), Disp: 90 tablet, Rfl: 0   gabapentin (NEURONTIN) 100 MG capsule, TAKE 1 CAPSULE BY MOUTH TWICE DAILY AS  NEEDED (Patient not taking: Reported on 09/25/2020), Disp: 60 capsule, Rfl: 0   lisinopril (ZESTRIL) 20 MG tablet, Take 1 tablet (20 mg total) by mouth daily. (Patient not taking: Reported on 09/25/2020), Disp: 90 tablet, Rfl: 0     Review of Systems  Per HPI unless specifically indicated above     Objective:    BP 126/90   Pulse (!) 59   Temp 98.1 F (36.7 C)   Wt 234 lb (106.1 kg)   SpO2 99%   BMI 39.55 kg/m   Wt Readings from Last 3 Encounters:  09/25/20 234 lb (106.1 kg)  05/27/20 247 lb 8 oz (112.3 kg)  03/21/20 243 lb 12.8 oz (110.6 kg)    Physical Exam Vitals reviewed.  Constitutional:      General: She is not in acute distress.    Appearance: She is well-developed. She is obese. She is not toxic-appearing.  HENT:     Head: Normocephalic and atraumatic.  Cardiovascular:     Rate and Rhythm: Normal rate and regular rhythm.  Pulmonary:     Effort: Pulmonary effort is normal.     Breath sounds: Normal breath sounds.  Abdominal:     General: Bowel sounds are normal.     Palpations: Abdomen is soft. There is  no mass.     Tenderness: There is no abdominal tenderness.  Musculoskeletal:     Cervical back: Neck supple.     Right lower leg: No edema.     Left lower leg: No edema.  Lymphadenopathy:     Cervical: No cervical adenopathy.  Skin:    General: Skin is warm and dry.  Neurological:     Mental Status: She is alert and oriented to person, place, and time.     Comments: Pt is walking slowly and with a cane  Psychiatric:        Behavior: Behavior normal.          Assessment & Plan:    Encounter Diagnoses  Name Primary?   Type 2 diabetes mellitus without complication, without long-term current use of insulin (HCC) Yes   Essential hypertension    Hyperlipidemia, unspecified hyperlipidemia type    Right hip pain    Impaired renal function    Obesity, unspecified classification, unspecified obesity type, unspecified whether serious comorbidity present        -Pt to get labs drawn.  She will be called with results -pt is encouraged to submit her [facility] [employer] financial assistance application.  Discussed that doing this will allow her to see specialist for the hip pain that she reports.  Pt is given another application -no medication changes today -pt to follow up  3 months.  She is to contact office sooner prn

## 2020-09-26 ENCOUNTER — Other Ambulatory Visit: Payer: Self-pay | Admitting: Physician Assistant

## 2020-12-09 ENCOUNTER — Other Ambulatory Visit: Payer: Self-pay | Admitting: Physician Assistant

## 2020-12-11 ENCOUNTER — Other Ambulatory Visit: Payer: Self-pay | Admitting: Physician Assistant

## 2020-12-11 DIAGNOSIS — I10 Essential (primary) hypertension: Secondary | ICD-10-CM

## 2020-12-11 DIAGNOSIS — E119 Type 2 diabetes mellitus without complications: Secondary | ICD-10-CM

## 2020-12-11 DIAGNOSIS — N289 Disorder of kidney and ureter, unspecified: Secondary | ICD-10-CM

## 2020-12-11 DIAGNOSIS — E785 Hyperlipidemia, unspecified: Secondary | ICD-10-CM

## 2020-12-25 ENCOUNTER — Other Ambulatory Visit (HOSPITAL_COMMUNITY)
Admission: RE | Admit: 2020-12-25 | Discharge: 2020-12-25 | Disposition: A | Discharge status: Home / Self Care | Payer: Medicaid Other | Source: Ambulatory Visit | Attending: Physician Assistant | Admitting: Physician Assistant

## 2020-12-25 DIAGNOSIS — I10 Essential (primary) hypertension: Secondary | ICD-10-CM | POA: Insufficient documentation

## 2020-12-25 DIAGNOSIS — E785 Hyperlipidemia, unspecified: Secondary | ICD-10-CM | POA: Insufficient documentation

## 2020-12-25 DIAGNOSIS — N289 Disorder of kidney and ureter, unspecified: Secondary | ICD-10-CM | POA: Insufficient documentation

## 2020-12-25 DIAGNOSIS — E119 Type 2 diabetes mellitus without complications: Secondary | ICD-10-CM | POA: Insufficient documentation

## 2020-12-25 LAB — LIPID PANEL
Cholesterol: 161 mg/dL (ref 0–200)
HDL: 60 mg/dL (ref >40)
LDL Cholesterol: 86 mg/dL (ref 0–99)
Total CHOL/HDL Ratio: 2.7 RATIO
Triglycerides: 76 mg/dL (ref <150)
VLDL: 15 mg/dL (ref 0–40)

## 2020-12-25 LAB — COMPREHENSIVE METABOLIC PANEL
ALT: 21 U/L (ref 0–44)
AST: 25 U/L (ref 15–41)
Albumin: 3.9 g/dL (ref 3.5–5.0)
Alkaline Phosphatase: 76 U/L (ref 38–126)
Anion gap: 6 (ref 5–15)
BUN: 14 mg/dL (ref 6–20)
CO2: 29 mmol/L (ref 22–32)
Calcium: 9.1 mg/dL (ref 8.9–10.3)
Chloride: 106 mmol/L (ref 98–111)
Creatinine, Ser: 0.88 mg/dL (ref 0.44–1.00)
GFR, Estimated: >60 mL/min (ref >60)
Glucose, Bld: 129 mg/dL — ABNORMAL HIGH (ref 70–99)
Potassium: 3.7 mmol/L (ref 3.5–5.1)
Sodium: 141 mmol/L (ref 135–145)
Total Bilirubin: 0.7 mg/dL (ref 0.3–1.2)
Total Protein: 6.8 g/dL (ref 6.5–8.1)

## 2020-12-25 LAB — HEMOGLOBIN A1C
Hgb A1c MFr Bld: 6.6 % — ABNORMAL HIGH (ref 4.8–5.6)
Mean Plasma Glucose: 142.72 mg/dL

## 2020-12-26 ENCOUNTER — Encounter: Payer: Self-pay | Admitting: Physician Assistant

## 2020-12-26 ENCOUNTER — Telehealth: Payer: Self-pay

## 2020-12-26 ENCOUNTER — Ambulatory Visit: Payer: Medicaid Other | Admitting: Physician Assistant

## 2020-12-26 VITALS — BP 143/83 | HR 76 | Temp 97.9°F | Wt 232.0 lb

## 2020-12-26 DIAGNOSIS — Z96641 Presence of right artificial hip joint: Secondary | ICD-10-CM

## 2020-12-26 DIAGNOSIS — M25551 Pain in right hip: Secondary | ICD-10-CM

## 2020-12-26 DIAGNOSIS — I10 Essential (primary) hypertension: Secondary | ICD-10-CM

## 2020-12-26 DIAGNOSIS — Z1211 Encounter for screening for malignant neoplasm of colon: Secondary | ICD-10-CM

## 2020-12-26 DIAGNOSIS — E669 Obesity, unspecified: Secondary | ICD-10-CM

## 2020-12-26 DIAGNOSIS — E785 Hyperlipidemia, unspecified: Secondary | ICD-10-CM

## 2020-12-26 DIAGNOSIS — E119 Type 2 diabetes mellitus without complications: Secondary | ICD-10-CM

## 2020-12-26 LAB — MICROALBUMIN, URINE: Microalb, Ur: <3 ug/mL — ABNORMAL HIGH

## 2020-12-26 MED ORDER — METFORMIN HCL 500 MG PO TABS: 500.0000 mg | ORAL_TABLET | Freq: Two times a day (BID) | ORAL | 0 refills | Status: DC

## 2020-12-26 MED ORDER — AMLODIPINE BESYLATE 10 MG PO TABS: 10.0000 mg | ORAL_TABLET | Freq: Every day | ORAL | 0 refills | Status: DC

## 2020-12-26 MED ORDER — ATORVASTATIN CALCIUM 20 MG PO TABS: 20.0000 mg | ORAL_TABLET | Freq: Every day | ORAL | 0 refills | Status: DC

## 2020-12-26 MED ORDER — DICLOFENAC-MISOPROSTOL 75-0.2 MG PO TBEC: 1.0000 | DELAYED_RELEASE_TABLET | Freq: Two times a day (BID) | ORAL | 0 refills | Status: AC | PRN

## 2020-12-26 MED ORDER — CITALOPRAM HYDROBROMIDE 10 MG PO TABS: ORAL_TABLET | ORAL | 0 refills | Status: DC

## 2020-12-26 MED ORDER — LISINOPRIL 20 MG PO TABS: ORAL_TABLET | ORAL | 0 refills | Status: DC

## 2020-12-26 MED ORDER — OMEPRAZOLE 20 MG PO CPDR: 20.0000 mg | DELAYED_RELEASE_CAPSULE | Freq: Every day | ORAL | 0 refills | Status: DC

## 2020-12-26 NOTE — Telephone Encounter (Signed)
Client called after her appointment with [facility] provider. She states she needs to renew to get her medications from [facility]. [facility] expired 12/14/20 and [facility] will expire 01/08/21.  Will plan to renew both. Client does not have Letter of support needed to process her [facility] today she prefers a visit Monday 12/30/20 at 0900. Offered a Home visit, but she prefers to come in person. Reviewed documents needed. In reviewing provider's notes will also make note to begin the [facility] Financial assistance application with client in order for her to see an orthopedist. Also noted that she shared transportation is an issue at times, will also get Transportation waiver signed at that time. Explained to client that we can assist to provide her transportation so she can get to her medical appointments if needed.  Client reports understanding.  Regarding [facility], explained to client that once we complete the application it is then sent to [facility] to review for approval. Client reports understanding.  Francee Nodal RN [employer]

## 2020-12-26 NOTE — Progress Notes (Signed)
BP (!) 143/83   Pulse 76   Temp 97.9 F (36.6 C)   Wt 232 lb (105.2 kg)   SpO2 98%   BMI 39.21 kg/m    Subjective:    Patient ID: Lisa [name], female    DOB: 1970/09/06, 50 y.o.   MRN: [id number]  HPI: Lisa [name] is a 50 y.o. female presenting on 12/26/2020 for Hyperlipidemia, Diabetes, and Hypertension   HPI  Pt had a negative covid 19 screening questionnaire.   Chief Complaint  Patient presents with   Hyperlipidemia   Diabetes   Hypertension    Pt is Doing well.   She says she has No probems.  She didn't get her cafa submitted.  She says it's due to transportation.   Attempting to get pt seen for hip pain since last november  She saw dr Ophelia Charter 03/21/20.   He wanted her to see dr Dolores Patty who did her surgery  but cannot due to they have no financial assistance  She's not sure if she got a flu shot here already or not.     Relevant past medical, surgical, family and social history reviewed and updated as indicated. Interim medical history since our last visit reviewed. Allergies and medications reviewed and updated.   Current Outpatient Medications:    amLODipine (NORVASC) 10 MG tablet, TAKE 1 Tablet BY MOUTH ONCE DAILY, Disp: 90 tablet, Rfl: 0   ARTHROTEC 75-0.2 MG TBEC, TAKE 1 Tablet  BY MOUTH TWICE DAILY AS NEEDED, Disp: 180 tablet, Rfl: 0   aspirin EC 325 MG tablet, Take 1 tablet (325 mg total) by mouth daily. For 30 days post op for DVT Prophylaxis, Disp: 30 tablet, Rfl: 0   atorvastatin (LIPITOR) 20 MG tablet, TAKE 1 Tablet BY MOUTH ONCE DAILY, Disp: 90 tablet, Rfl: 0   citalopram (CELEXA) 10 MG tablet, TAKE 1 Tablet BY MOUTH ONCE EVERY DAY, Disp: 90 tablet, Rfl: 0   gabapentin (NEURONTIN) 100 MG capsule, TAKE 1 CAPSULE BY MOUTH TWICE DAILY AS NEEDED, Disp: 60 capsule, Rfl: 0   metFORMIN (GLUCOPHAGE) 500 MG tablet, TAKE 1 Tablet  BY MOUTH TWICE DAILY WITH A MEAL, Disp: 180 tablet, Rfl: 0   omeprazole (PRILOSEC) 20 MG capsule, TAKE 1 Capsule BY MOUTH  ONCE DAILY, Disp: 90 capsule, Rfl: 0   lisinopril (ZESTRIL) 20 MG tablet, TAKE 1 Tablet BY MOUTH ONCE EVERY DAY (Patient not taking: Reported on 12/26/2020), Disp: 90 tablet, Rfl: 0    Review of Systems  Per HPI unless specifically indicated above     Objective:    BP (!) 143/83   Pulse 76   Temp 97.9 F (36.6 C)   Wt 232 lb (105.2 kg)   SpO2 98%   BMI 39.21 kg/m   Wt Readings from Last 3 Encounters:  12/26/20 232 lb (105.2 kg)  09/25/20 234 lb (106.1 kg)  05/27/20 247 lb 8 oz (112.3 kg)    Physical Exam Vitals reviewed.  Constitutional:      General: She is not in acute distress.    Appearance: She is well-developed. She is obese. She is not ill-appearing.  HENT:     Head: Normocephalic and atraumatic.  Cardiovascular:     Rate and Rhythm: Normal rate and regular rhythm.  Pulmonary:     Effort: Pulmonary effort is normal.     Breath sounds: Normal breath sounds.  Abdominal:     General: Bowel sounds are normal.     Palpations: Abdomen is soft. There  is no mass.     Tenderness: There is no abdominal tenderness.  Musculoskeletal:     Cervical back: Neck supple.     Right lower leg: No edema.     Left lower leg: No edema.  Lymphadenopathy:     Cervical: No cervical adenopathy.  Skin:    General: Skin is warm and dry.  Neurological:     Mental Status: She is alert and oriented to person, place, and time.  Psychiatric:        Behavior: Behavior normal.    Results for orders placed or performed during the hospital encounter of 12/25/20  Hemoglobin A1c  Result Value Ref Range   Hgb A1c MFr Bld 6.6 (H) 4.8 - 5.6 %   Mean Plasma Glucose 142.72 mg/dL  Lipid panel  Result Value Ref Range   Cholesterol 161 0 - 200 mg/dL   Triglycerides 76 <778 mg/dL   HDL 60 >24 mg/dL   Total CHOL/HDL Ratio 2.7 RATIO   VLDL 15 0 - 40 mg/dL   LDL Cholesterol 86 0 - 99 mg/dL  Comprehensive metabolic panel  Result Value Ref Range   Sodium 141 135 - 145 mmol/L   Potassium 3.7  3.5 - 5.1 mmol/L   Chloride 106 98 - 111 mmol/L   CO2 29 22 - 32 mmol/L   Glucose, Bld 129 (H) 70 - 99 mg/dL   BUN 14 6 - 20 mg/dL   Creatinine, Ser 2.35 0.44 - 1.00 mg/dL   Calcium 9.1 8.9 - 36.1 mg/dL   Total Protein 6.8 6.5 - 8.1 g/dL   Albumin 3.9 3.5 - 5.0 g/dL   AST 25 15 - 41 U/L   ALT 21 0 - 44 U/L   Alkaline Phosphatase 76 38 - 126 U/L   Total Bilirubin 0.7 0.3 - 1.2 mg/dL   GFR, Estimated >44 >31 mL/min   Anion gap 6 5 - 15      Assessment & Plan:   Encounter Diagnoses  Name Primary?   Type 2 diabetes mellitus without complication, without long-term current use of insulin (HCC) Yes   Essential hypertension    Hyperlipidemia, unspecified hyperlipidemia type    Right hip pain    Obesity, unspecified classification, unspecified obesity type, unspecified whether serious comorbidity present    S/P total right hip arthroplasty    Screening for colon cancer       -Reviewed labs will pt -pt to Continue current medications.  She needs to get back on lisinopril  -refer for Mammogram due after december 21 -pt is given another Cafa    so can go to orthopedics.  Discussed with her that she can get help completing the application and transportation to get it done if she contact [facility]. -Recommended covid booster and appt was scheduled.  She already got flu shot -DM foot exam was updated -pt was given  FIT test for colon cancer screening -she will follow up in  3 months.  She is to contact office sooner prn

## 2020-12-28 ENCOUNTER — Other Ambulatory Visit: Payer: Self-pay | Admitting: Physician Assistant

## 2020-12-28 DIAGNOSIS — Z1211 Encounter for screening for malignant neoplasm of colon: Secondary | ICD-10-CM

## 2020-12-30 LAB — IFOBT (OCCULT BLOOD): IFOBT: NEGATIVE

## 2021-01-14 ENCOUNTER — Telehealth: Payer: Self-pay | Admitting: Licensed Clinical Social Worker

## 2021-01-14 NOTE — Telephone Encounter (Signed)
[facility] reached patient via phone call, patient confirmed that she is interested in counseling services, first appointment was scheduled for 11/8 at 9 am.

## 2021-01-21 ENCOUNTER — Telehealth: Payer: Self-pay | Admitting: Licensed Clinical Social Worker

## 2021-01-21 ENCOUNTER — Ambulatory Visit: Payer: Medicaid Other | Admitting: Licensed Clinical Social Worker

## 2021-01-21 NOTE — Telephone Encounter (Signed)
[facility] attempted to reach patient via phone call regarding rescheduling first counseling appointment. Patient no showed for fist scheduled BH appointment on current date. Voicemail was left.

## 2021-02-12 ENCOUNTER — Other Ambulatory Visit: Payer: Self-pay

## 2021-02-12 DIAGNOSIS — Z1231 Encounter for screening mammogram for malignant neoplasm of breast: Secondary | ICD-10-CM

## 2021-02-13 ENCOUNTER — Telehealth: Payer: Self-pay

## 2021-02-13 NOTE — Telephone Encounter (Signed)
Called pt to inform Mammogram scheduled 03/12/21 1:15pm. Left vm to call back

## 2021-02-19 ENCOUNTER — Telehealth: Payer: Self-pay

## 2021-02-19 NOTE — Telephone Encounter (Signed)
Called pt & informed mammogram appt 03/12/21 at 115pm.

## 2021-03-12 ENCOUNTER — Other Ambulatory Visit: Payer: Self-pay

## 2021-03-12 ENCOUNTER — Ambulatory Visit (HOSPITAL_COMMUNITY)
Admission: RE | Admit: 2021-03-12 | Discharge: 2021-03-12 | Disposition: A | Discharge status: Home / Self Care | Payer: Medicaid Other | Source: Ambulatory Visit | Attending: Physician Assistant | Admitting: Physician Assistant

## 2021-03-12 DIAGNOSIS — Z1231 Encounter for screening mammogram for malignant neoplasm of breast: Secondary | ICD-10-CM | POA: Insufficient documentation

## 2021-03-20 ENCOUNTER — Other Ambulatory Visit: Payer: Self-pay | Admitting: Physician Assistant

## 2021-03-20 DIAGNOSIS — E785 Hyperlipidemia, unspecified: Secondary | ICD-10-CM

## 2021-03-20 DIAGNOSIS — E119 Type 2 diabetes mellitus without complications: Secondary | ICD-10-CM

## 2021-03-20 DIAGNOSIS — I10 Essential (primary) hypertension: Secondary | ICD-10-CM

## 2021-04-01 ENCOUNTER — Other Ambulatory Visit (HOSPITAL_COMMUNITY)
Admission: RE | Admit: 2021-04-01 | Discharge: 2021-04-01 | Disposition: A | Discharge status: Home / Self Care | Payer: Medicaid Other | Source: Ambulatory Visit | Attending: Physician Assistant | Admitting: Physician Assistant

## 2021-04-01 ENCOUNTER — Other Ambulatory Visit: Payer: Self-pay

## 2021-04-01 ENCOUNTER — Encounter: Payer: Self-pay | Admitting: Physician Assistant

## 2021-04-01 ENCOUNTER — Ambulatory Visit: Payer: Medicaid Other | Admitting: Physician Assistant

## 2021-04-01 VITALS — BP 127/91 | HR 74 | Temp 97.1°F | Wt 233.0 lb

## 2021-04-01 DIAGNOSIS — M25551 Pain in right hip: Secondary | ICD-10-CM

## 2021-04-01 DIAGNOSIS — F32A Depression, unspecified: Secondary | ICD-10-CM

## 2021-04-01 DIAGNOSIS — E785 Hyperlipidemia, unspecified: Secondary | ICD-10-CM | POA: Insufficient documentation

## 2021-04-01 DIAGNOSIS — E119 Type 2 diabetes mellitus without complications: Secondary | ICD-10-CM | POA: Insufficient documentation

## 2021-04-01 DIAGNOSIS — I10 Essential (primary) hypertension: Secondary | ICD-10-CM | POA: Insufficient documentation

## 2021-04-01 DIAGNOSIS — E669 Obesity, unspecified: Secondary | ICD-10-CM

## 2021-04-01 LAB — LIPID PANEL
Cholesterol: 174 mg/dL (ref 0–200)
HDL: 60 mg/dL (ref >40)
LDL Cholesterol: 98 mg/dL (ref 0–99)
Total CHOL/HDL Ratio: 2.9 RATIO
Triglycerides: 78 mg/dL (ref <150)
VLDL: 16 mg/dL (ref 0–40)

## 2021-04-01 LAB — HEMOGLOBIN A1C
Hgb A1c MFr Bld: 7.1 % — ABNORMAL HIGH (ref 4.8–5.6)
Mean Plasma Glucose: 157.07 mg/dL

## 2021-04-01 LAB — COMPREHENSIVE METABOLIC PANEL
ALT: 21 U/L (ref 0–44)
AST: 20 U/L (ref 15–41)
Albumin: 4.3 g/dL (ref 3.5–5.0)
Alkaline Phosphatase: 84 U/L (ref 38–126)
Anion gap: 10 (ref 5–15)
BUN: 16 mg/dL (ref 6–20)
CO2: 25 mmol/L (ref 22–32)
Calcium: 9.2 mg/dL (ref 8.9–10.3)
Chloride: 106 mmol/L (ref 98–111)
Creatinine, Ser: 0.83 mg/dL (ref 0.44–1.00)
GFR, Estimated: >60 mL/min (ref >60)
Glucose, Bld: 115 mg/dL — ABNORMAL HIGH (ref 70–99)
Potassium: 3.4 mmol/L — ABNORMAL LOW (ref 3.5–5.1)
Sodium: 141 mmol/L (ref 135–145)
Total Bilirubin: 0.6 mg/dL (ref 0.3–1.2)
Total Protein: 7.7 g/dL (ref 6.5–8.1)

## 2021-04-01 NOTE — Progress Notes (Signed)
BP (!) 127/91    Pulse 74    Temp (!) 97.1 F (36.2 C)    Wt 233 lb (105.7 kg)    SpO2 96%    BMI 39.38 kg/m    Subjective:    Patient ID: Lisa [name], female    DOB: Jun 01, 1970, 51 y.o.   MRN: [id number]  HPI: Lisa [name] is a 51 y.o. female presenting on 04/01/2021 for Diabetes, Hyperlipidemia, and Hypertension   HPI  Chief Complaint  Patient presents with   Diabetes   Hyperlipidemia   Hypertension      Pt did not get labs drawn  Her mood is better.  She feels like the citalopram helps.   She missed her appointment with [facility].   She is interested in rescheduling.  She denies SI, HI.  Her hip is still painful.   She has never been back to see orthopedics.   She doesn't think she ever submitted her cafa.  Thus no appointment scheduled with orthopedics  She [place] medicaid     Relevant past medical, surgical, family and social history reviewed and updated as indicated. Interim medical history since our last visit reviewed. Allergies and medications reviewed and updated.   Current Outpatient Medications:    amLODipine (NORVASC) 10 MG tablet, Take 1 tablet (10 mg total) by mouth daily., Disp: 90 tablet, Rfl: 0   aspirin EC 325 MG tablet, Take 1 tablet (325 mg total) by mouth daily. For 30 days post op for DVT Prophylaxis, Disp: 30 tablet, Rfl: 0   atorvastatin (LIPITOR) 20 MG tablet, Take 1 tablet (20 mg total) by mouth daily., Disp: 90 tablet, Rfl: 0   citalopram (CELEXA) 10 MG tablet, TAKE 1 Tablet BY MOUTH ONCE EVERY DAY, Disp: 90 tablet, Rfl: 0   Diclofenac-miSOPROStol (ARTHROTEC) 75-0.2 MG TBEC, Take 1 tablet by mouth 2 (two) times daily as needed., Disp: 180 tablet, Rfl: 0   gabapentin (NEURONTIN) 100 MG capsule, TAKE 1 CAPSULE BY MOUTH TWICE DAILY AS NEEDED, Disp: 60 capsule, Rfl: 0   lisinopril (ZESTRIL) 20 MG tablet, TAKE 1 Tablet BY MOUTH ONCE EVERY DAY, Disp: 90 tablet, Rfl: 0   metFORMIN (GLUCOPHAGE) 500 MG tablet, Take 1 tablet (500 mg total) by mouth  2 (two) times daily with a meal., Disp: 180 tablet, Rfl: 0   omeprazole (PRILOSEC) 20 MG capsule, Take 1 capsule (20 mg total) by mouth daily., Disp: 90 capsule, Rfl: 0   Review of Systems  Per HPI unless specifically indicated above     Objective:    BP (!) 127/91    Pulse 74    Temp (!) 97.1 F (36.2 C)    Wt 233 lb (105.7 kg)    SpO2 96%    BMI 39.38 kg/m   Wt Readings from Last 3 Encounters:  04/01/21 233 lb (105.7 kg)  12/26/20 232 lb (105.2 kg)  09/25/20 234 lb (106.1 kg)    Physical Exam Vitals reviewed.  Constitutional:      General: She is not in acute distress.    Appearance: She is well-developed. She is obese. She is not toxic-appearing.  HENT:     Head: Normocephalic and atraumatic.  Cardiovascular:     Rate and Rhythm: Normal rate and regular rhythm.  Pulmonary:     Effort: Pulmonary effort is normal.     Breath sounds: Normal breath sounds.  Abdominal:     General: Bowel sounds are normal.     Palpations: Abdomen is soft.  There is no mass.     Tenderness: There is no abdominal tenderness.  Musculoskeletal:     Cervical back: Neck supple.     Right lower leg: No edema.     Left lower leg: No edema.  Lymphadenopathy:     Cervical: No cervical adenopathy.  Skin:    General: Skin is warm and dry.  Neurological:     Mental Status: She is alert and oriented to person, place, and time.  Psychiatric:        Behavior: Behavior normal.          Assessment & Plan:    Encounter Diagnoses  Name Primary?   Type 2 diabetes mellitus without complication, without long-term current use of insulin (HCC) Yes   Essential hypertension    Hyperlipidemia, unspecified hyperlipidemia type    Obesity, unspecified classification, unspecified obesity type, unspecified whether serious comorbidity present    Right hip pain    Depression, unspecified depression type      -Pt to get labs drawn.  She will be called with results -Will check on cafa.  (After pt left  office, it was found that she has no cafa.  She was called and encouraged to submit application with [facility]) -She still has referral to go to orthopedist for her hip.  She can call when she gets her cafa -she is Scheduled with [facility] -Encouraged weight loss with healthy diet & exercise -Pt to follow up 4 months.   She is to contact office sooner prn

## 2021-04-29 ENCOUNTER — Encounter: Payer: Self-pay | Admitting: Physician Assistant

## 2021-04-29 ENCOUNTER — Ambulatory Visit: Payer: Medicaid Other | Admitting: Physician Assistant

## 2021-04-29 ENCOUNTER — Other Ambulatory Visit: Payer: Self-pay

## 2021-04-29 VITALS — BP 121/81 | HR 73 | Temp 98.0°F | Wt 234.0 lb

## 2021-04-29 DIAGNOSIS — I10 Essential (primary) hypertension: Secondary | ICD-10-CM

## 2021-04-29 MED ORDER — METOPROLOL TARTRATE 50 MG PO TABS: 50.0000 mg | ORAL_TABLET | Freq: Two times a day (BID) | ORAL | 0 refills | Status: DC

## 2021-04-29 NOTE — Progress Notes (Signed)
BP 121/81    Pulse 73    Temp 98 F (36.7 C)    Wt 234 lb (106.1 kg)    SpO2 98%    BMI 39.55 kg/m    Subjective:    Patient ID: Lisa [name], female    DOB: October 04, 1970, 51 y.o.   MRN: [id number]  HPI: Lisa [name] is a 51 y.o. female presenting on 04/29/2021 for Follow-up   HPI   Pt is 50yoF who presents for follow up of ER visit.  She Went to ER Saturday.  She was told to stop lisinopril due to angioedema.  She says all of her lip swelling is gone now.  She is not having any sob.       Relevant past medical, surgical, family and social history reviewed and updated as indicated. Interim medical history since our last visit reviewed. Allergies and medications reviewed and updated.   Current Outpatient Medications:    amLODipine (NORVASC) 10 MG tablet, Take 1 tablet (10 mg total) by mouth daily., Disp: 90 tablet, Rfl: 0   aspirin EC 325 MG tablet, Take 1 tablet (325 mg total) by mouth daily. For 30 days post op for DVT Prophylaxis, Disp: 30 tablet, Rfl: 0   atorvastatin (LIPITOR) 20 MG tablet, Take 1 tablet (20 mg total) by mouth daily., Disp: 90 tablet, Rfl: 0   citalopram (CELEXA) 10 MG tablet, TAKE 1 Tablet BY MOUTH ONCE EVERY DAY, Disp: 90 tablet, Rfl: 0   Diclofenac-miSOPROStol (ARTHROTEC) 75-0.2 MG TBEC, Take 1 tablet by mouth 2 (two) times daily as needed., Disp: 180 tablet, Rfl: 0   gabapentin (NEURONTIN) 100 MG capsule, TAKE 1 CAPSULE BY MOUTH TWICE DAILY AS NEEDED, Disp: 60 capsule, Rfl: 0   metFORMIN (GLUCOPHAGE) 500 MG tablet, Take 1 tablet (500 mg total) by mouth 2 (two) times daily with a meal., Disp: 180 tablet, Rfl: 0   omeprazole (PRILOSEC) 20 MG capsule, Take 1 capsule (20 mg total) by mouth daily., Disp: 90 capsule, Rfl: 0   lisinopril (ZESTRIL) 20 MG tablet, TAKE 1 Tablet BY MOUTH ONCE EVERY DAY (Patient not taking: Reported on 04/29/2021), Disp: 90 tablet, Rfl: 0    Review of Systems  Per HPI unless specifically indicated above     Objective:     BP 121/81    Pulse 73    Temp 98 F (36.7 C)    Wt 234 lb (106.1 kg)    SpO2 98%    BMI 39.55 kg/m   Wt Readings from Last 3 Encounters:  04/29/21 234 lb (106.1 kg)  04/01/21 233 lb (105.7 kg)  12/26/20 232 lb (105.2 kg)    Physical Exam Constitutional:      General: She is not in acute distress.    Appearance: She is not ill-appearing.  HENT:     Head: Normocephalic and atraumatic.     Mouth/Throat:     Mouth: Mucous membranes are moist.     Pharynx: Oropharynx is clear. No pharyngeal swelling.     Comments: Lips without swelling Cardiovascular:     Rate and Rhythm: Normal rate and regular rhythm.  Pulmonary:     Effort: Pulmonary effort is normal. No respiratory distress.     Breath sounds: Normal breath sounds.  Neurological:     Mental Status: She is alert and oriented to person, place, and time.  Psychiatric:        Mood and Affect: Mood normal.        Behavior: Behavior  normal.          Assessment & Plan:   Encounter Diagnosis  Name Primary?   Essential hypertension Yes     -pt was given BP machine and shown how to use it -start metoprolol to replace her lisinopril -pt to follow up 1 month to recheck bp.  She is to contact office sooner prn

## 2021-05-05 ENCOUNTER — Other Ambulatory Visit: Payer: Self-pay | Admitting: Physician Assistant

## 2021-05-07 ENCOUNTER — Other Ambulatory Visit: Payer: Self-pay | Admitting: Physician Assistant

## 2021-05-07 MED ORDER — METOPROLOL TARTRATE 50 MG PO TABS: 50.0000 mg | ORAL_TABLET | Freq: Two times a day (BID) | ORAL | 0 refills | Status: DC

## 2021-05-07 MED ORDER — ATORVASTATIN CALCIUM 20 MG PO TABS: 20.0000 mg | ORAL_TABLET | Freq: Every day | ORAL | 0 refills | Status: DC

## 2021-05-07 MED ORDER — CITALOPRAM HYDROBROMIDE 10 MG PO TABS: ORAL_TABLET | ORAL | 0 refills | Status: DC

## 2021-05-07 MED ORDER — AMLODIPINE BESYLATE 10 MG PO TABS: 10.0000 mg | ORAL_TABLET | Freq: Every day | ORAL | 0 refills | Status: DC

## 2021-05-07 MED ORDER — OMEPRAZOLE 20 MG PO CPDR: 20.0000 mg | DELAYED_RELEASE_CAPSULE | Freq: Every day | ORAL | 0 refills | Status: DC

## 2021-05-07 MED ORDER — METFORMIN HCL 500 MG PO TABS: 500.0000 mg | ORAL_TABLET | Freq: Two times a day (BID) | ORAL | 0 refills | Status: DC

## 2021-05-27 ENCOUNTER — Other Ambulatory Visit: Payer: Self-pay

## 2021-05-27 ENCOUNTER — Encounter: Payer: Self-pay | Admitting: Physician Assistant

## 2021-05-27 ENCOUNTER — Ambulatory Visit: Payer: Medicaid Other | Admitting: Physician Assistant

## 2021-05-27 VITALS — BP 127/86 | HR 61 | Temp 97.1°F | Wt 232.0 lb

## 2021-05-27 DIAGNOSIS — I10 Essential (primary) hypertension: Secondary | ICD-10-CM

## 2021-05-27 NOTE — Progress Notes (Signed)
? ?BP 127/86   Pulse 61   Temp (!) 97.1 ?F (36.2 ?C)   Wt 232 lb (105.2 kg)   SpO2 99%   BMI 39.21 kg/m?   ? ?Subjective:  ? ? Patient ID: Lisa [name], female    DOB: 03/06/1971, 51 y.o.   MRN: [id number] ? ?HPI: ?Lisa [name] is a 51 y.o. female presenting on 05/27/2021 for Hypertension ? ? ?HPI ? ? ?Chief Complaint  ?Patient presents with  ? Hypertension  ? ? ? ?Pt was scheduled for virtual appointment today but she showed up so was seen in person.  She says she is doing well with her new bp meds.  She is checking her bp at home and says that it has been good.  She has no complaints today.   ? ?Pt was referred to orthopedics in October but she has not yet submitted her cafa.  She agrees with cancelling referral at this time. ? ? ?Relevant past medical, surgical, family and social history reviewed and updated as indicated. Interim medical history since our last visit reviewed. ?Allergies and medications reviewed and updated. ? ? ?Current Outpatient Medications:  ?  amLODipine (NORVASC) 10 MG tablet, Take 1 tablet (10 mg total) by mouth daily., Disp: 90 tablet, Rfl: 0 ?  aspirin EC 325 MG tablet, Take 1 tablet (325 mg total) by mouth daily. For 30 days post op for DVT Prophylaxis, Disp: 30 tablet, Rfl: 0 ?  atorvastatin (LIPITOR) 20 MG tablet, Take 1 tablet (20 mg total) by mouth daily., Disp: 90 tablet, Rfl: 0 ?  citalopram (CELEXA) 10 MG tablet, TAKE 1 Tablet BY MOUTH ONCE EVERY DAY, Disp: 90 tablet, Rfl: 0 ?  Diclofenac-miSOPROStol (ARTHROTEC) 75-0.2 MG TBEC, Take 1 tablet by mouth 2 (two) times daily as needed., Disp: 180 tablet, Rfl: 0 ?  gabapentin (NEURONTIN) 100 MG capsule, TAKE 1 CAPSULE BY MOUTH TWICE DAILY AS NEEDED, Disp: 60 capsule, Rfl: 0 ?  metFORMIN (GLUCOPHAGE) 500 MG tablet, Take 1 tablet (500 mg total) by mouth 2 (two) times daily with a meal., Disp: 180 tablet, Rfl: 0 ?  metoprolol tartrate (LOPRESSOR) 50 MG tablet, Take 1 tablet (50 mg total) by mouth 2 (two) times daily., Disp: 180  tablet, Rfl: 0 ?  omeprazole (PRILOSEC) 20 MG capsule, Take 1 capsule (20 mg total) by mouth daily., Disp: 90 capsule, Rfl: 0 ? ? ? ?Review of Systems ? ?Per HPI unless specifically indicated above ? ?   ?Objective:  ?  ?BP 127/86   Pulse 61   Temp (!) 97.1 ?F (36.2 ?C)   Wt 232 lb (105.2 kg)   SpO2 99%   BMI 39.21 kg/m?   ?Wt Readings from Last 3 Encounters:  ?05/27/21 232 lb (105.2 kg)  ?04/29/21 234 lb (106.1 kg)  ?04/01/21 233 lb (105.7 kg)  ?  ?Physical Exam ?Vitals reviewed.  ?Constitutional:   ?   General: She is not in acute distress. ?   Appearance: She is well-developed. She is obese. She is not toxic-appearing.  ?HENT:  ?   Head: Normocephalic and atraumatic.  ?Cardiovascular:  ?   Rate and Rhythm: Normal rate and regular rhythm.  ?Pulmonary:  ?   Effort: Pulmonary effort is normal.  ?   Breath sounds: Normal breath sounds.  ?Abdominal:  ?   General: Bowel sounds are normal.  ?   Palpations: Abdomen is soft. There is no mass.  ?   Tenderness: There is no abdominal tenderness.  ?Musculoskeletal:  ?  Cervical back: Neck supple.  ?   Right lower leg: No edema.  ?   Left lower leg: No edema.  ?Lymphadenopathy:  ?   Cervical: No cervical adenopathy.  ?Skin: ?   General: Skin is warm and dry.  ?Neurological:  ?   Mental Status: She is alert and oriented to person, place, and time.  ?Psychiatric:     ?   Behavior: Behavior normal.  ? ? ? ? ?   ?Assessment & Plan:  ? ? ?Encounter Diagnosis  ?Name Primary?  ? Essential hypertension Yes  ? ? ? ?-Pt to continue current medications ?-pt was encouraged to exercise regularly/stay active ?-pt to follow up 6 weeks with updating labs before appointment.  She is to contact office sooner prn ? ?

## 2021-06-19 ENCOUNTER — Other Ambulatory Visit: Payer: Self-pay | Admitting: Physician Assistant

## 2021-06-19 DIAGNOSIS — E119 Type 2 diabetes mellitus without complications: Secondary | ICD-10-CM

## 2021-06-19 DIAGNOSIS — E785 Hyperlipidemia, unspecified: Secondary | ICD-10-CM

## 2021-06-19 DIAGNOSIS — I10 Essential (primary) hypertension: Secondary | ICD-10-CM

## 2021-07-08 ENCOUNTER — Ambulatory Visit: Payer: Medicaid Other | Admitting: Physician Assistant

## 2021-07-22 ENCOUNTER — Encounter: Payer: Self-pay | Admitting: Physician Assistant

## 2021-07-22 ENCOUNTER — Ambulatory Visit: Payer: Medicaid Other | Admitting: Physician Assistant

## 2021-07-22 VITALS — BP 144/98 | HR 62 | Temp 98.0°F | Wt 231.0 lb

## 2021-07-22 DIAGNOSIS — F32A Depression, unspecified: Secondary | ICD-10-CM

## 2021-07-22 DIAGNOSIS — M25551 Pain in right hip: Secondary | ICD-10-CM

## 2021-07-22 DIAGNOSIS — I10 Essential (primary) hypertension: Secondary | ICD-10-CM

## 2021-07-22 DIAGNOSIS — E785 Hyperlipidemia, unspecified: Secondary | ICD-10-CM

## 2021-07-22 DIAGNOSIS — Z96641 Presence of right artificial hip joint: Secondary | ICD-10-CM

## 2021-07-22 DIAGNOSIS — E119 Type 2 diabetes mellitus without complications: Secondary | ICD-10-CM

## 2021-07-22 DIAGNOSIS — E669 Obesity, unspecified: Secondary | ICD-10-CM

## 2021-07-22 NOTE — Progress Notes (Signed)
? ?BP (!) 144/98   Pulse 62   Temp 98 ?F (36.7 ?C)   Wt 231 lb (104.8 kg)   SpO2 99%   BMI 39.04 kg/m?   ? ?Subjective:  ? ? Patient ID: Lisa [name], female    DOB: 24-Dec-1970, 51 y.o.   MRN: [id number] ? ?HPI: ?Lisa [name] is a 50 y.o. female presenting on 07/22/2021 for Hypertension, Diabetes, and Hyperlipidemia ? ? ?HPI ? ? ?Chief Complaint  ?Patient presents with  ? Hypertension  ? Diabetes  ? Hyperlipidemia  ? ? ? ? ?Pt says she is Not feeling well.   she says her legs are hurting.   She says it is worse now than in the past; it is more on the right than left.    Discussed that at last appt she wanted to cancel her referral to orthopedics.  Today she asks to be referred again.  Discussed that she needs to submit the cafa/[employer] financial assistance application and she says she will.  ? 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  The left is not bothering her.  The right has been hurting for about 2 years.   ? ?She did not get her labs drawn. ? ?She says the citalopram is working well for her mood.  ? ? ? ? ? ?Relevant past medical, surgical, family and social history reviewed and updated as indicated. Interim medical history since our last visit reviewed. ?Allergies and medications reviewed and updated. ? ? ? ?Current Outpatient Medications:  ?  amLODipine (NORVASC) 10 MG tablet, Take 1 tablet (10 mg total) by mouth daily., Disp: 90 tablet, Rfl: 0 ?  aspirin EC 325 MG tablet, Take 1 tablet (325 mg total) by mouth daily. For 30 days post op for DVT Prophylaxis, Disp: 30 tablet, Rfl: 0 ?  atorvastatin (LIPITOR) 20 MG tablet, Take 1 tablet (20 mg total) by mouth daily., Disp: 90 tablet, Rfl: 0 ?  citalopram (CELEXA) 10 MG tablet, TAKE 1 Tablet BY MOUTH ONCE EVERY DAY, Disp: 90 tablet, Rfl: 0 ?  Diclofenac-miSOPROStol (ARTHROTEC) 75-0.2 MG TBEC, Take 1 tablet by mouth 2 (two) times daily as needed., Disp: 180 tablet, Rfl: 0 ?  metFORMIN (GLUCOPHAGE) 500 MG tablet, Take 1 tablet (500 mg total) by mouth 2  (two) times daily with a meal., Disp: 180 tablet, Rfl: 0 ?  metoprolol tartrate (LOPRESSOR) 50 MG tablet, Take 1 tablet (50 mg total) by mouth 2 (two) times daily., Disp: 180 tablet, Rfl: 0 ?  omeprazole (PRILOSEC) 20 MG capsule, Take 1 capsule (20 mg total) by mouth daily., Disp: 90 capsule, Rfl: 0 ?  gabapentin (NEURONTIN) 100 MG capsule, TAKE 1 CAPSULE BY MOUTH TWICE DAILY AS NEEDED (Patient not taking: Reported on 07/22/2021), Disp: 60 capsule, Rfl: 0 ? ? ? ?Review of Systems ? ?Per HPI unless specifically indicated above ? ?   ?Objective:  ?  ?BP (!) 144/98   Pulse 62   Temp 98 ?F (36.7 ?C)   Wt 231 lb (104.8 kg)   SpO2 99%   BMI 39.04 kg/m?   ?Wt Readings from Last 3 Encounters:  ?07/22/21 231 lb (104.8 kg)  ?05/27/21 232 lb (105.2 kg)  ?04/29/21 234 lb (106.1 kg)  ?  ?Physical Exam ?Vitals reviewed.  ?Constitutional:   ?   General: She is not in acute distress. ?   Appearance: She is well-developed. She is obese. She is not toxic-appearing.  ?HENT:  ?   Head: Normocephalic  and atraumatic.  ?Cardiovascular:  ?   Rate and Rhythm: Normal rate and regular rhythm.  ?Pulmonary:  ?   Effort: Pulmonary effort is normal.  ?   Breath sounds: Normal breath sounds.  ?Abdominal:  ?   General: Bowel sounds are normal.  ?   Palpations: Abdomen is soft. There is no mass.  ?   Tenderness: There is no abdominal tenderness.  ?Musculoskeletal:  ?   Cervical back: Neck supple.  ?   Right hip: Tenderness present. Decreased range of motion.  ?   Left hip: No tenderness. Normal range of motion.  ?Lymphadenopathy:  ?   Cervical: No cervical adenopathy.  ?Skin: ?   General: Skin is warm and dry.  ?Neurological:  ?   Mental Status: She is alert and oriented to person, place, and time.  ?   Comments: Pt ambulating with assistance of 4 prong cane  ?Psychiatric:     ?   Behavior: Behavior normal.  ? ? ? ? ? ?   ?Assessment & Plan:  ? ?Encounter Diagnoses  ?Name Primary?  ? Right hip pain Yes  ? Type 2 diabetes mellitus without  complication, without long-term current use of insulin (HCC)   ? Essential hypertension   ? Hyperlipidemia, unspecified hyperlipidemia type   ? Obesity, unspecified classification, unspecified obesity type, unspecified whether serious comorbidity present   ? Depression, unspecified depression type   ? S/P total right hip arthroplasty   ? ? ? ?-pt is given Cafa/application for [employer] financial assistance ?-will update Xray (so she will have a bill to get accepted for cafa) ?-pt to get fasting Labs ?-refer for DM eye exam ?-She has [place] med[place]- she needs to call gyn for pap ?-will follow up with pt via Virtual appt 2-3 wk.  Will review labs, xray and make sure cafa submitted and check on referrals.   She is to contact office sooner prn ? ?

## 2021-07-30 ENCOUNTER — Ambulatory Visit: Payer: Medicaid Other | Admitting: Physician Assistant

## 2021-08-06 ENCOUNTER — Other Ambulatory Visit: Payer: Self-pay | Admitting: Physician Assistant

## 2021-08-07 ENCOUNTER — Ambulatory Visit: Payer: Self-pay | Admitting: Orthopaedic Surgery

## 2021-08-12 ENCOUNTER — Ambulatory Visit: Payer: Medicaid Other | Admitting: Physician Assistant

## 2021-08-22 ENCOUNTER — Other Ambulatory Visit (HOSPITAL_COMMUNITY)
Admission: RE | Admit: 2021-08-22 | Discharge: 2021-08-22 | Disposition: A | Discharge status: Home / Self Care | Payer: Medicaid Other | Source: Ambulatory Visit | Attending: Physician Assistant | Admitting: Physician Assistant

## 2021-08-22 DIAGNOSIS — E785 Hyperlipidemia, unspecified: Secondary | ICD-10-CM | POA: Insufficient documentation

## 2021-08-22 DIAGNOSIS — I10 Essential (primary) hypertension: Secondary | ICD-10-CM | POA: Insufficient documentation

## 2021-08-22 DIAGNOSIS — E119 Type 2 diabetes mellitus without complications: Secondary | ICD-10-CM | POA: Insufficient documentation

## 2021-08-22 LAB — COMPREHENSIVE METABOLIC PANEL
ALT: 15 U/L (ref 0–44)
AST: 20 U/L (ref 15–41)
Albumin: 4 g/dL (ref 3.5–5.0)
Alkaline Phosphatase: 66 U/L (ref 38–126)
Anion gap: 6 (ref 5–15)
BUN: 20 mg/dL (ref 6–20)
CO2: 26 mmol/L (ref 22–32)
Calcium: 9.7 mg/dL (ref 8.9–10.3)
Chloride: 109 mmol/L (ref 98–111)
Creatinine, Ser: 1.05 mg/dL — ABNORMAL HIGH (ref 0.44–1.00)
GFR, Estimated: >60 mL/min (ref >60)
Glucose, Bld: 92 mg/dL (ref 70–99)
Potassium: 3.9 mmol/L (ref 3.5–5.1)
Sodium: 141 mmol/L (ref 135–145)
Total Bilirubin: 0.6 mg/dL (ref 0.3–1.2)
Total Protein: 7.3 g/dL (ref 6.5–8.1)

## 2021-08-22 LAB — LIPID PANEL
Cholesterol: 176 mg/dL (ref 0–200)
HDL: 52 mg/dL (ref >40)
LDL Cholesterol: 101 mg/dL — ABNORMAL HIGH (ref 0–99)
Total CHOL/HDL Ratio: 3.4 RATIO
Triglycerides: 117 mg/dL (ref <150)
VLDL: 23 mg/dL (ref 0–40)

## 2021-08-22 LAB — HEMOGLOBIN A1C
Hgb A1c MFr Bld: 6.8 % — ABNORMAL HIGH (ref 4.8–5.6)
Mean Plasma Glucose: 148.46 mg/dL

## 2021-08-25 ENCOUNTER — Ambulatory Visit: Payer: Medicaid Other | Admitting: Physician Assistant

## 2022-06-25 DIAGNOSIS — Z6834 Body mass index (BMI) 34.0-34.9, adult: Secondary | ICD-10-CM | POA: Diagnosis not present

## 2022-06-25 DIAGNOSIS — G894 Chronic pain syndrome: Secondary | ICD-10-CM | POA: Diagnosis not present

## 2022-06-25 DIAGNOSIS — E119 Type 2 diabetes mellitus without complications: Secondary | ICD-10-CM | POA: Diagnosis not present

## 2022-06-25 DIAGNOSIS — M87851 Other osteonecrosis, right femur: Secondary | ICD-10-CM | POA: Diagnosis not present

## 2022-06-25 DIAGNOSIS — E6609 Other obesity due to excess calories: Secondary | ICD-10-CM | POA: Diagnosis not present

## 2022-06-25 DIAGNOSIS — I10 Essential (primary) hypertension: Secondary | ICD-10-CM | POA: Diagnosis not present

## 2022-07-24 DIAGNOSIS — Z6834 Body mass index (BMI) 34.0-34.9, adult: Secondary | ICD-10-CM | POA: Diagnosis not present

## 2022-07-24 DIAGNOSIS — G894 Chronic pain syndrome: Secondary | ICD-10-CM | POA: Diagnosis not present

## 2022-07-24 DIAGNOSIS — M87859 Other osteonecrosis, unspecified femur: Secondary | ICD-10-CM | POA: Diagnosis not present

## 2022-07-24 DIAGNOSIS — I10 Essential (primary) hypertension: Secondary | ICD-10-CM | POA: Diagnosis not present

## 2022-07-24 DIAGNOSIS — M87851 Other osteonecrosis, right femur: Secondary | ICD-10-CM | POA: Diagnosis not present

## 2022-07-24 DIAGNOSIS — K5732 Diverticulitis of large intestine without perforation or abscess without bleeding: Secondary | ICD-10-CM | POA: Diagnosis not present

## 2022-08-20 DIAGNOSIS — I10 Essential (primary) hypertension: Secondary | ICD-10-CM | POA: Diagnosis not present

## 2022-08-20 DIAGNOSIS — E6609 Other obesity due to excess calories: Secondary | ICD-10-CM | POA: Diagnosis not present

## 2022-08-20 DIAGNOSIS — M87859 Other osteonecrosis, unspecified femur: Secondary | ICD-10-CM | POA: Diagnosis not present

## 2022-08-20 DIAGNOSIS — E119 Type 2 diabetes mellitus without complications: Secondary | ICD-10-CM | POA: Diagnosis not present

## 2022-08-20 DIAGNOSIS — M87851 Other osteonecrosis, right femur: Secondary | ICD-10-CM | POA: Diagnosis not present

## 2022-08-20 DIAGNOSIS — Z6834 Body mass index (BMI) 34.0-34.9, adult: Secondary | ICD-10-CM | POA: Diagnosis not present

## 2022-08-20 DIAGNOSIS — G894 Chronic pain syndrome: Secondary | ICD-10-CM | POA: Diagnosis not present

## 2022-09-22 DIAGNOSIS — M87859 Other osteonecrosis, unspecified femur: Secondary | ICD-10-CM | POA: Diagnosis not present

## 2022-09-22 DIAGNOSIS — I10 Essential (primary) hypertension: Secondary | ICD-10-CM | POA: Diagnosis not present

## 2022-09-22 DIAGNOSIS — E6609 Other obesity due to excess calories: Secondary | ICD-10-CM | POA: Diagnosis not present

## 2022-09-22 DIAGNOSIS — G9332 Myalgic encephalomyelitis/chronic fatigue syndrome: Secondary | ICD-10-CM | POA: Diagnosis not present

## 2022-09-22 DIAGNOSIS — M87851 Other osteonecrosis, right femur: Secondary | ICD-10-CM | POA: Diagnosis not present

## 2022-09-22 DIAGNOSIS — G894 Chronic pain syndrome: Secondary | ICD-10-CM | POA: Diagnosis not present

## 2022-09-22 DIAGNOSIS — E119 Type 2 diabetes mellitus without complications: Secondary | ICD-10-CM | POA: Diagnosis not present

## 2022-09-22 DIAGNOSIS — Z6833 Body mass index (BMI) 33.0-33.9, adult: Secondary | ICD-10-CM | POA: Diagnosis not present

## 2022-09-22 DIAGNOSIS — K5732 Diverticulitis of large intestine without perforation or abscess without bleeding: Secondary | ICD-10-CM | POA: Diagnosis not present

## 2022-09-22 DIAGNOSIS — K047 Periapical abscess without sinus: Secondary | ICD-10-CM | POA: Diagnosis not present

## 2022-10-22 DIAGNOSIS — Z6834 Body mass index (BMI) 34.0-34.9, adult: Secondary | ICD-10-CM | POA: Diagnosis not present

## 2022-10-22 DIAGNOSIS — M87859 Other osteonecrosis, unspecified femur: Secondary | ICD-10-CM | POA: Diagnosis not present

## 2022-10-22 DIAGNOSIS — E6609 Other obesity due to excess calories: Secondary | ICD-10-CM | POA: Diagnosis not present

## 2022-10-22 DIAGNOSIS — I10 Essential (primary) hypertension: Secondary | ICD-10-CM | POA: Diagnosis not present

## 2022-10-22 DIAGNOSIS — M87851 Other osteonecrosis, right femur: Secondary | ICD-10-CM | POA: Diagnosis not present

## 2022-10-22 DIAGNOSIS — G894 Chronic pain syndrome: Secondary | ICD-10-CM | POA: Diagnosis not present

## 2022-11-10 DIAGNOSIS — G8929 Other chronic pain: Secondary | ICD-10-CM | POA: Diagnosis not present

## 2022-11-10 DIAGNOSIS — E119 Type 2 diabetes mellitus without complications: Secondary | ICD-10-CM | POA: Diagnosis not present

## 2022-11-10 DIAGNOSIS — Z96643 Presence of artificial hip joint, bilateral: Secondary | ICD-10-CM | POA: Diagnosis not present

## 2022-11-10 DIAGNOSIS — Z7984 Long term (current) use of oral hypoglycemic drugs: Secondary | ICD-10-CM | POA: Diagnosis not present

## 2022-11-10 DIAGNOSIS — M25552 Pain in left hip: Secondary | ICD-10-CM | POA: Diagnosis not present

## 2022-11-10 DIAGNOSIS — I10 Essential (primary) hypertension: Secondary | ICD-10-CM | POA: Diagnosis not present

## 2022-11-10 DIAGNOSIS — M25551 Pain in right hip: Secondary | ICD-10-CM | POA: Diagnosis not present

## 2022-11-10 DIAGNOSIS — Z79899 Other long term (current) drug therapy: Secondary | ICD-10-CM | POA: Diagnosis not present

## 2022-11-20 DIAGNOSIS — I10 Essential (primary) hypertension: Secondary | ICD-10-CM | POA: Diagnosis not present

## 2022-11-20 DIAGNOSIS — Z6833 Body mass index (BMI) 33.0-33.9, adult: Secondary | ICD-10-CM | POA: Diagnosis not present

## 2022-11-20 DIAGNOSIS — E6609 Other obesity due to excess calories: Secondary | ICD-10-CM | POA: Diagnosis not present

## 2022-11-20 DIAGNOSIS — G894 Chronic pain syndrome: Secondary | ICD-10-CM | POA: Diagnosis not present

## 2022-11-20 DIAGNOSIS — M87851 Other osteonecrosis, right femur: Secondary | ICD-10-CM | POA: Diagnosis not present

## 2022-12-17 DIAGNOSIS — M87851 Other osteonecrosis, right femur: Secondary | ICD-10-CM | POA: Diagnosis not present

## 2022-12-17 DIAGNOSIS — G894 Chronic pain syndrome: Secondary | ICD-10-CM | POA: Diagnosis not present

## 2023-01-15 DIAGNOSIS — M87851 Other osteonecrosis, right femur: Secondary | ICD-10-CM | POA: Diagnosis not present

## 2023-01-15 DIAGNOSIS — G894 Chronic pain syndrome: Secondary | ICD-10-CM | POA: Diagnosis not present

## 2023-01-15 DIAGNOSIS — M87859 Other osteonecrosis, unspecified femur: Secondary | ICD-10-CM | POA: Diagnosis not present

## 2023-01-15 DIAGNOSIS — I10 Essential (primary) hypertension: Secondary | ICD-10-CM | POA: Diagnosis not present

## 2023-02-16 DIAGNOSIS — E6609 Other obesity due to excess calories: Secondary | ICD-10-CM | POA: Diagnosis not present

## 2023-02-16 DIAGNOSIS — G894 Chronic pain syndrome: Secondary | ICD-10-CM | POA: Diagnosis not present

## 2023-02-16 DIAGNOSIS — Z6834 Body mass index (BMI) 34.0-34.9, adult: Secondary | ICD-10-CM | POA: Diagnosis not present

## 2023-03-19 DIAGNOSIS — G894 Chronic pain syndrome: Secondary | ICD-10-CM | POA: Diagnosis not present

## 2023-03-19 DIAGNOSIS — M25512 Pain in left shoulder: Secondary | ICD-10-CM | POA: Diagnosis not present

## 2023-03-19 DIAGNOSIS — E6609 Other obesity due to excess calories: Secondary | ICD-10-CM | POA: Diagnosis not present

## 2023-03-19 DIAGNOSIS — Z6835 Body mass index (BMI) 35.0-35.9, adult: Secondary | ICD-10-CM | POA: Diagnosis not present

## 2023-10-23 IMAGING — MG MM DIGITAL SCREENING BILAT W/ TOMO AND CAD
8 series · 8 of 24 positions shown · non-contrast
Comparison: Previous exam(s).

CLINICAL DATA: Screening.

EXAM:
DIGITAL SCREENING BILATERAL MAMMOGRAM WITH TOMOSYNTHESIS AND CAD
TECHNIQUE: Bilateral screening digital craniocaudal and mediolateral oblique
mammograms were obtained. Bilateral screening digital breast
tomosynthesis was performed. The images were evaluated with
computer-aided detection.

[L CC synth-2D]
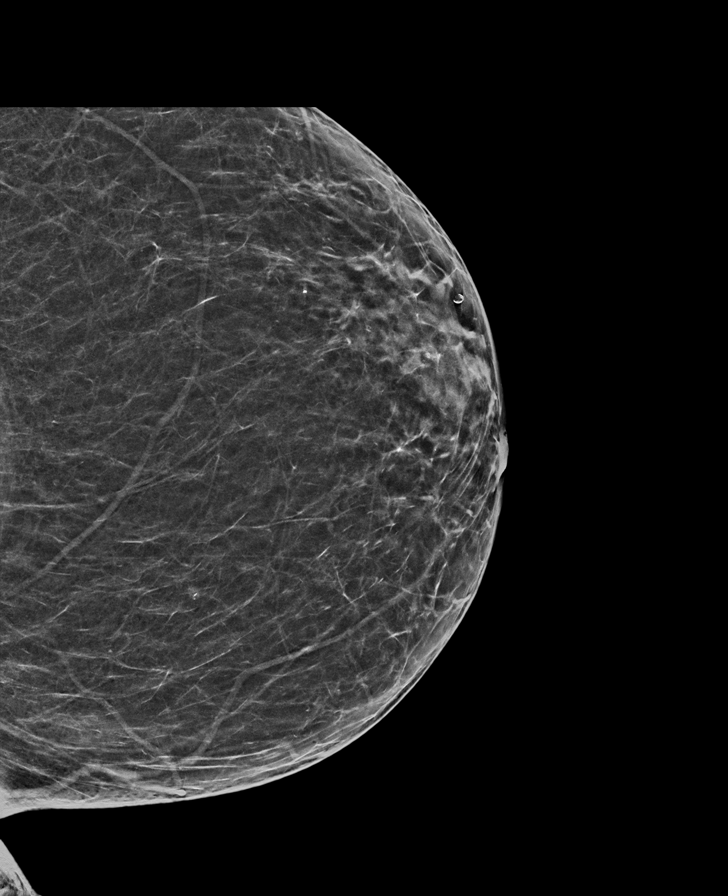

[L MLO synth-2D]
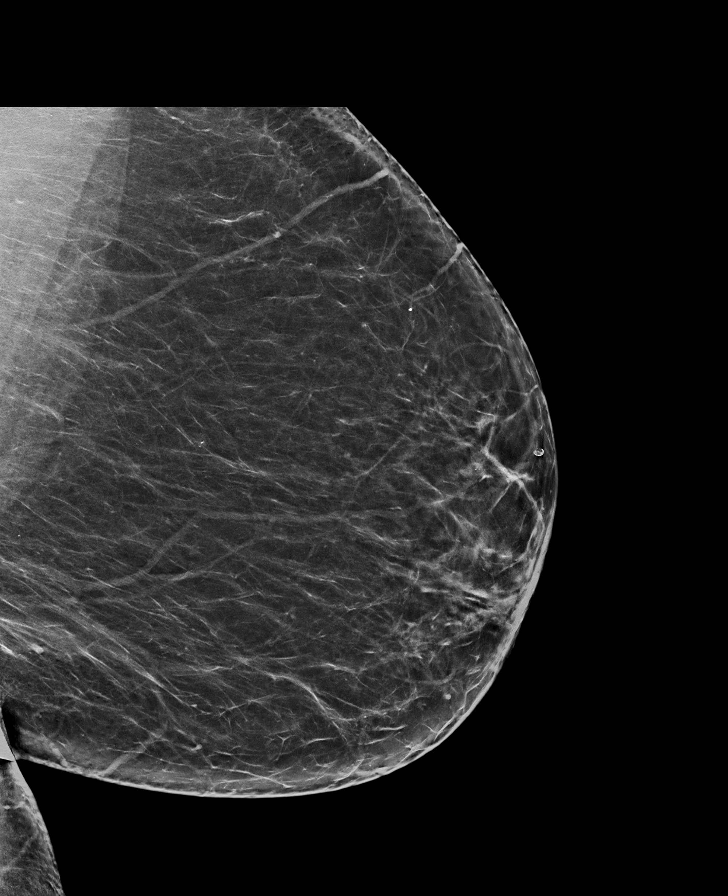

[R MLO synth-2D]
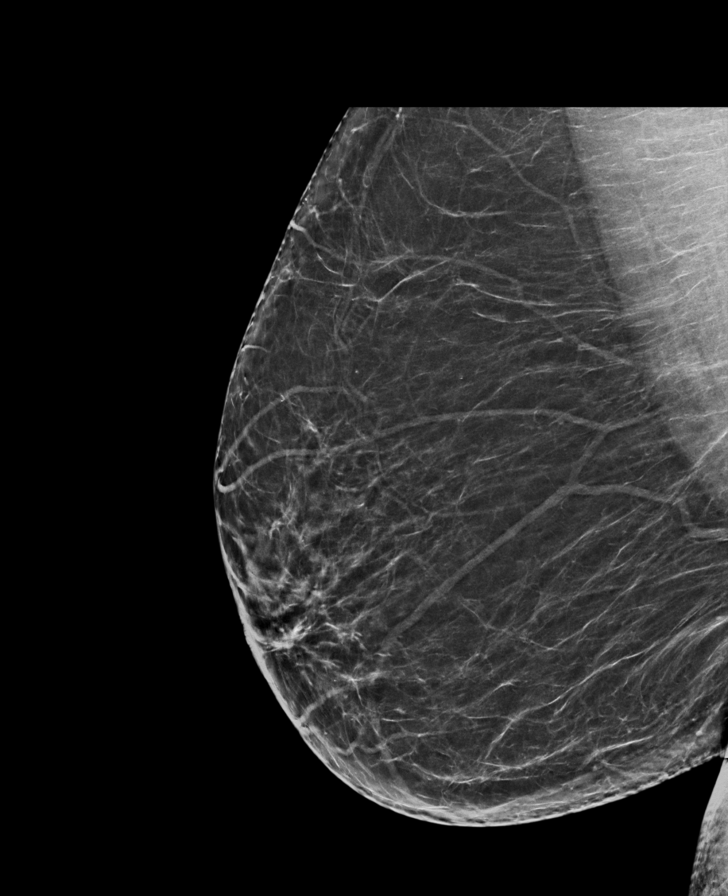

[R CC synth-2D]
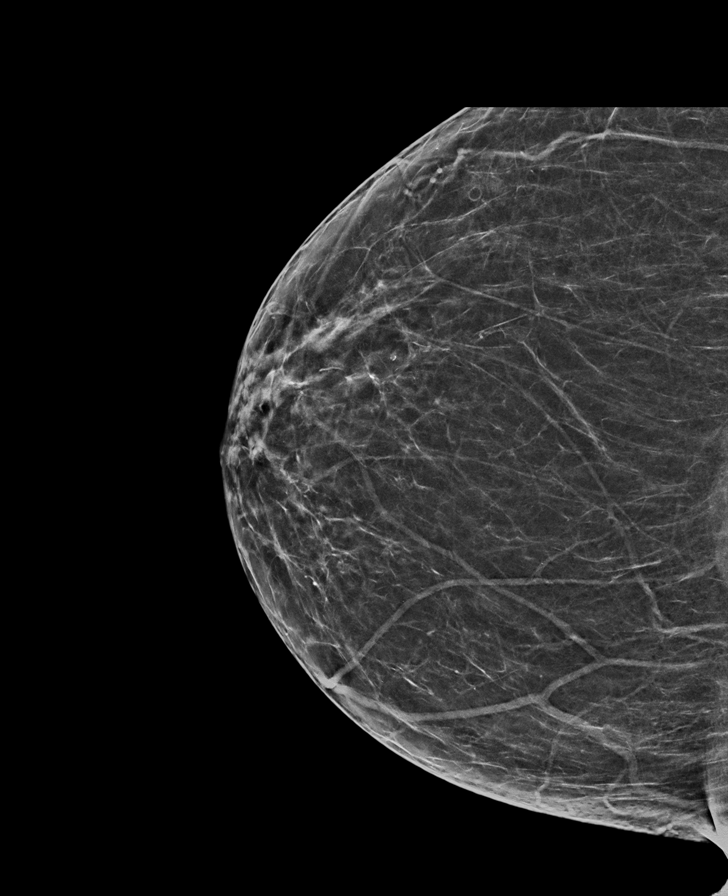

[R MLO tomo · tomo slice 37/73.0]
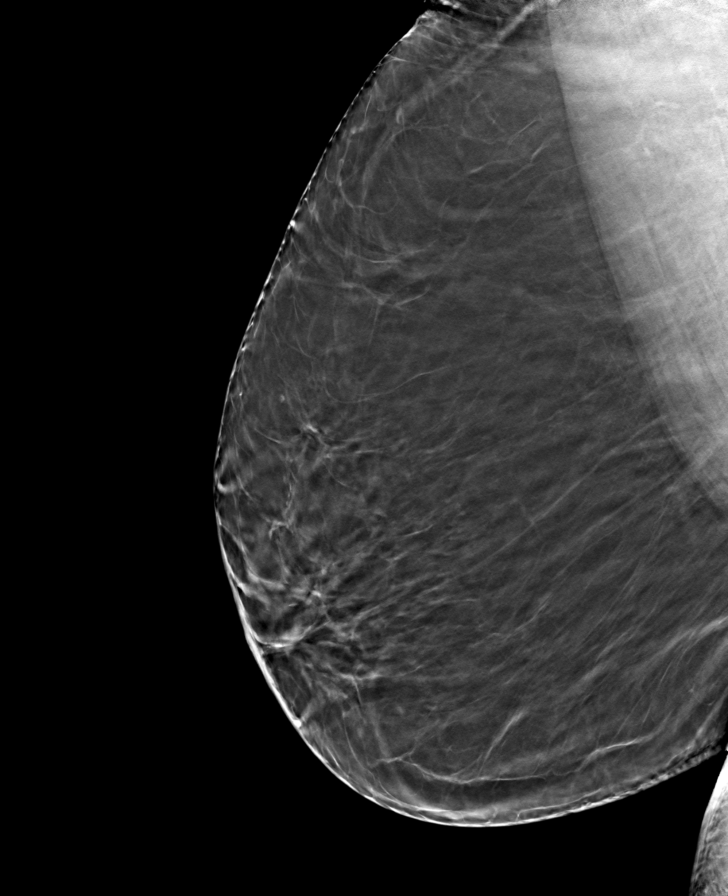

[L CC tomo · tomo slice 33/65.0]
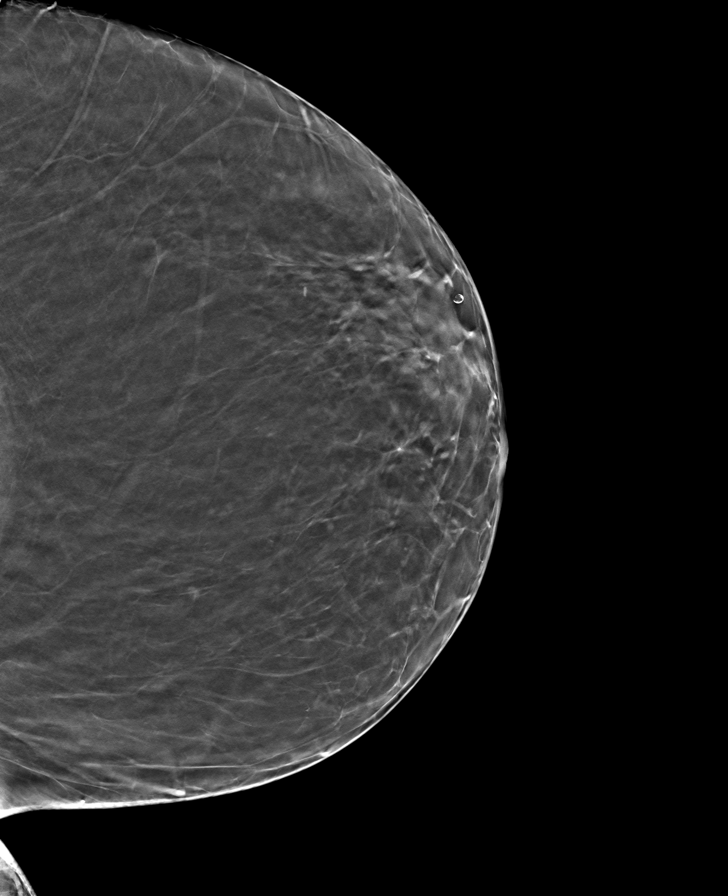

[R CC tomo · tomo slice 33/66.0]
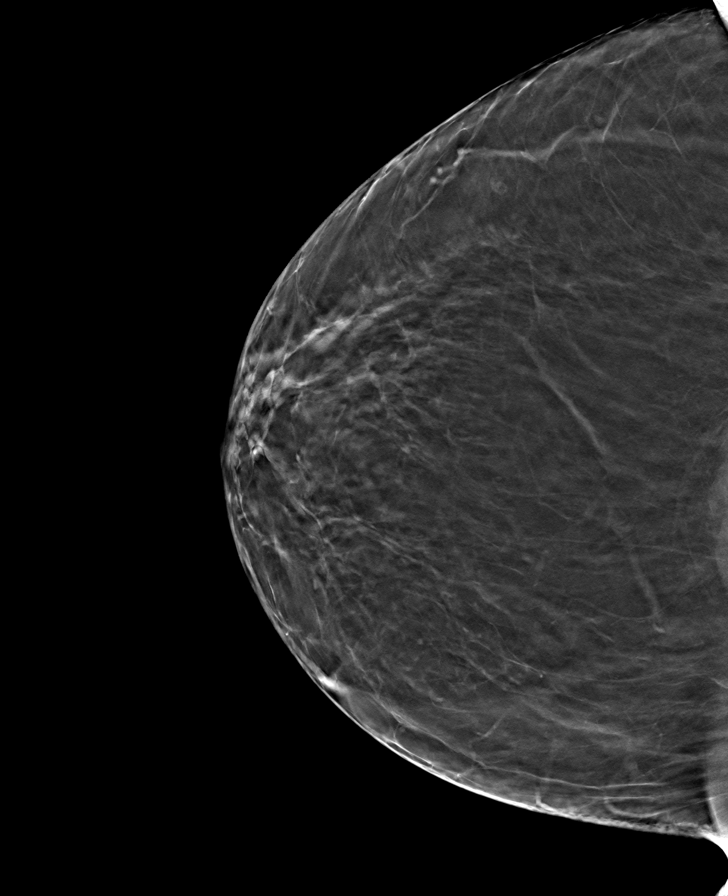

[L MLO tomo · tomo slice 39/76.0]
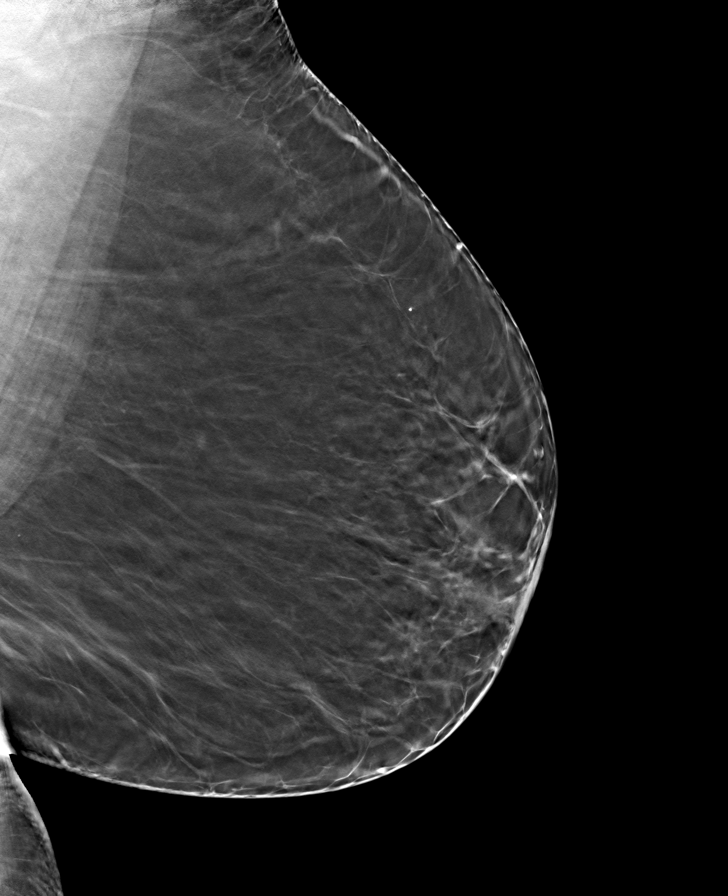

[8 of 24 positions shown; findings below may reference images not displayed]

ACR Breast Density Category b: There are scattered areas of
fibroglandular density.
FINDINGS: There are no findings suspicious for malignancy.
IMPRESSION: No mammographic evidence of malignancy. A result letter of this
screening mammogram will be mailed directly to the patient.

RECOMMENDATION:
Screening mammogram in one year. (Code:51-O-LD2)

BI-RADS CATEGORY  1: Negative.

## 2023-12-08 NOTE — ED Provider Notes (Signed)
 Emergency Department Provider Note    ED Clinical Impression   Final diagnoses:  Chronic right hip pain (Primary)    ED Assessment/Plan    Condition: Stable Disposition: Discharge  This chart has been completed using Dragon Medical Dictation software, and while attempts have been made to ensure accuracy, certain words and phrases may not be transcribed as intended.   History   Chief Complaint  Patient presents with  . Hip Pain Non-Traumatic   HPI  Lisa [name] is a 53 y.o. female  who presents today to the  emergency department complaining of chronic hip pain.  She reports she has a history of pain in the same hip and was told that she needed a hip replacement.  Says she has not followed up with the orthopedic doctors.  She denies falls or injuries.  She says she woke up this morning pain was worse than normal.  She is using a cane to help with ambulation due to the pain.  She denies falls, trauma, numbness, tingling, weakness of the legs, bowel or bladder incontinence, back pain, fever.  She denies taking medication for back pain    Allergies: is allergic to lisinopril ; peanut; dexrazoxane analogues; latex, natural rubber; sulfa (sulfonamide antibiotics); and doxycycline. Medications: has a current medication list which includes the following long-term medication(s): famotidine, lisinopril , and metformin . PMHx:  has a past medical history of Arthritis, Depression, Diabetes mellitus    (CMS-HCC), High lactic acid dehydrogenase (LDH) level, and Hypertension. PSHx:  has a past surgical history that includes Hysterectomy. SocHx:  reports that she has never smoked. She has never used smokeless tobacco. She reports that she does not drink alcohol and does not use drugs. Allergies, Medications, Medical, Surgical, and Social History were reviewed as documented above.   Social Drivers of Health  with Concerns   Food Insecurity: Not on file  Tobacco Use: Medium Risk (07/22/2021)   Received from [facility]   Patient History   . Smoking Tobacco Use: Former   . Smokeless Tobacco Use: Never   . Passive Exposure: Not on file  Transportation Needs: Not on file  Alcohol Use: Not on file  Housing: Not on file  Physical Activity: Not on file  Utilities: Not on file  Stress: Not on file  Interpersonal Safety: Not on file  Substance Use: Not on file (01/20/2023)  Intimate Partner Violence: Not on file  Social Connections: Not on file  Financial Resource Strain: Not on file  Health Literacy: Not on file  Internet Connectivity: Not on file     Review Of Systems  Review of Systems  All other systems reviewed and are negative.   Physical Exam   BP 188/98   Pulse 59   Temp 36.5 C (97.7 F) (Oral)   Resp 16   Ht 167.6 cm (5' 6)   Wt 95.4 kg (210 lb 4.8 oz)   SpO2 99%   BMI 33.94 kg/m   Physical Exam Vitals and nursing note reviewed.  Constitutional:      Appearance: Normal appearance.  HENT:     Head: Normocephalic.     Mouth/Throat:     Pharynx: Oropharynx is clear.  Eyes:     Conjunctiva/sclera: Conjunctivae normal.  Cardiovascular:     Rate and Rhythm: Normal rate.  Pulses: Normal pulses.  Pulmonary:     Effort: Pulmonary effort is normal. No respiratory distress.  Musculoskeletal:     Cervical back: Normal range of motion and neck supple. No tenderness.     Comments: Legs are equal length, no deformity or external rotation of the extremity.  Decreased range of motion with hip flexion and extension due to pain but equal strength in upper and lower extremities  Skin:    General: Skin is warm.  Neurological:     General: No focal deficit present.     Mental Status: She is alert.     Sensory: No sensory deficit.     Motor: No weakness.     Deep Tendon Reflexes: Reflexes normal.     ED Course  Medical Decision Making 53 year old female presents with  right hip pain.  Reports chronic pain in this hip and has been told she needs a hip replacement but has not followed up with orthopedic doctors.  She denies taking medication for pain.  No focal neurodeficits on examination.  She does have limited range of motion due to pain on the right side.  She has not had recent imaging, will get x-ray, shot of Toradol  and will need outpatient orthopedic follow-up  X-ray negative for acute process.  Will refer to orthopedics for further evaluation  Amount and/or Complexity of Data Reviewed Radiology: ordered.  Risk Prescription drug management.     Procedures   No results found for this visit on 12/08/23 (from the past 4464 hours).   ED Results No results found for any visits on 12/08/23. XR Hip 2 Views Right Result Date: 12/08/2023 Exam:  Right Hip   History:  Pain  Technique:  2 views  Comparison:  02/01/2022  Findings:  Changes of prior bilateral hip arthroplasties. No periprosthetic fracture or lucency. Alignment similar prior study.    Unremarkable appearance post right hip arthroplasty.  Signed (Electronic Signature): 12/08/2023 8:27 AM Signed By: Aleene JAYSON Billet, MD   Medications Administered:  Medications  ketorolac  (TORADOL ) injection 30 mg (30 mg Intramuscular Given 12/08/23 0811)    Discharge Medications (Medications Prescribed during this  ED visit and Patient's Home Medications) :    Your Medication List     CHANGE how you take these medications    cyclobenzaprine  10 MG tablet Commonly known as: FLEXERIL  Take 1 tablet (10 mg total) by mouth two (2) times a day as needed. What changed:  when to take this reasons to take this       ASK your doctor about these medications    diphenhydrAMINE  25 mg capsule Commonly known as: BENADRYL  Take 1 capsule (25 mg total) by mouth every six (6) hours as needed for itching.   famotidine 20 MG tablet Commonly known as: PEPCID Take 1 tablet (20 mg total) by mouth daily before  breakfast for 5 days.   ibuprofen  800 MG tablet Commonly known as: MOTRIN  Take 1 tablet (800 mg total) by mouth every eight (8) hours as needed for pain.   ketotifen 0.025 % (0.035 %) ophthalmic solution Commonly known as: ZADITOR Administer 1 drop to both eyes two (2) times a day.   lisinopril  10 MG tablet Commonly known as: PRINIVIL ,ZESTRIL  Take by mouth daily.   metFORMIN  500 MG (MOD) 24 hr tablet Commonly known as: GLUMETZA  Take by mouth 2 (two) times a day with meals.   predniSONE 20 MG tablet Commonly known as: DELTASONE 3 po daily for 2 days, then 2 po daily for  2 days, then 1 po daily for 2 days          Peri Jackee Victory Mickey., MD 12/08/23 [phone]

## 2024-01-07 ENCOUNTER — Encounter (INDEPENDENT_AMBULATORY_CARE_PROVIDER_SITE_OTHER): Payer: Self-pay | Admitting: *Deleted
